# Patient Record
Sex: Female | Born: 1935 | Race: White | Hispanic: No | Smoking: Former smoker
Health system: Southern US, Community
[De-identification: ages and names within clinical notes are randomized; demographics above are authoritative.]

## PROBLEM LIST (undated history)

## (undated) DIAGNOSIS — F329 Major depressive disorder, single episode, unspecified: Secondary | ICD-10-CM

## (undated) DIAGNOSIS — F0390 Unspecified dementia without behavioral disturbance: Secondary | ICD-10-CM

## (undated) DIAGNOSIS — T7840XA Allergy, unspecified, initial encounter: Secondary | ICD-10-CM

## (undated) DIAGNOSIS — I00 Rheumatic fever without heart involvement: Secondary | ICD-10-CM

## (undated) DIAGNOSIS — E079 Disorder of thyroid, unspecified: Secondary | ICD-10-CM

## (undated) DIAGNOSIS — I639 Cerebral infarction, unspecified: Secondary | ICD-10-CM

## (undated) DIAGNOSIS — N390 Urinary tract infection, site not specified: Secondary | ICD-10-CM

## (undated) DIAGNOSIS — Z8711 Personal history of peptic ulcer disease: Secondary | ICD-10-CM

## (undated) DIAGNOSIS — E119 Type 2 diabetes mellitus without complications: Secondary | ICD-10-CM

## (undated) DIAGNOSIS — Z8719 Personal history of other diseases of the digestive system: Secondary | ICD-10-CM

## (undated) DIAGNOSIS — J45909 Unspecified asthma, uncomplicated: Secondary | ICD-10-CM

## (undated) DIAGNOSIS — F32A Depression, unspecified: Secondary | ICD-10-CM

## (undated) DIAGNOSIS — E785 Hyperlipidemia, unspecified: Secondary | ICD-10-CM

## (undated) DIAGNOSIS — I10 Essential (primary) hypertension: Secondary | ICD-10-CM

## (undated) DIAGNOSIS — M1990 Unspecified osteoarthritis, unspecified site: Secondary | ICD-10-CM

## (undated) HISTORY — DX: Personal history of peptic ulcer disease: Z87.11

## (undated) HISTORY — PX: APPENDECTOMY: SHX54

## (undated) HISTORY — DX: Type 2 diabetes mellitus without complications: E11.9

## (undated) HISTORY — DX: Hyperlipidemia, unspecified: E78.5

## (undated) HISTORY — DX: Allergy, unspecified, initial encounter: T78.40XA

## (undated) HISTORY — PX: TONSILLECTOMY: SUR1361

## (undated) HISTORY — DX: Unspecified asthma, uncomplicated: J45.909

## (undated) HISTORY — DX: Essential (primary) hypertension: I10

## (undated) HISTORY — PX: ABDOMINAL HYSTERECTOMY: SHX81

## (undated) HISTORY — DX: Depression, unspecified: F32.A

## (undated) HISTORY — DX: Rheumatic fever without heart involvement: I00

## (undated) HISTORY — PX: CHOLECYSTECTOMY: SHX55

## (undated) HISTORY — DX: Urinary tract infection, site not specified: N39.0

## (undated) HISTORY — DX: Major depressive disorder, single episode, unspecified: F32.9

## (undated) HISTORY — DX: Unspecified osteoarthritis, unspecified site: M19.90

## (undated) HISTORY — DX: Cerebral infarction, unspecified: I63.9

## (undated) HISTORY — DX: Disorder of thyroid, unspecified: E07.9

## (undated) HISTORY — DX: Personal history of other diseases of the digestive system: Z87.19

---

## 2016-12-28 ENCOUNTER — Encounter: Payer: Self-pay | Admitting: Family Medicine

## 2016-12-31 ENCOUNTER — Encounter: Payer: Self-pay | Admitting: Family Medicine

## 2017-01-13 ENCOUNTER — Encounter: Payer: Self-pay | Admitting: Family Medicine

## 2017-01-28 ENCOUNTER — Encounter: Payer: Self-pay | Admitting: Family Medicine

## 2017-02-17 ENCOUNTER — Encounter: Payer: Self-pay | Admitting: Family Medicine

## 2017-03-18 ENCOUNTER — Encounter: Payer: Self-pay | Admitting: Family Medicine

## 2017-06-03 ENCOUNTER — Encounter: Payer: Self-pay | Admitting: Family Medicine

## 2017-06-10 ENCOUNTER — Encounter: Payer: Self-pay | Admitting: Family Medicine

## 2017-06-11 ENCOUNTER — Encounter: Payer: Self-pay | Admitting: Family Medicine

## 2017-06-25 ENCOUNTER — Encounter: Payer: Self-pay | Admitting: Family Medicine

## 2017-07-25 ENCOUNTER — Encounter: Payer: Self-pay | Admitting: Family Medicine

## 2017-11-05 ENCOUNTER — Inpatient Hospital Stay (HOSPITAL_COMMUNITY)
Admission: EM | Admit: 2017-11-05 | Discharge: 2017-11-07 | DRG: 193 | Disposition: A | Discharge status: Home Health | Payer: Medicare Other | Attending: Internal Medicine | Admitting: Internal Medicine

## 2017-11-05 ENCOUNTER — Emergency Department (HOSPITAL_COMMUNITY): Payer: Medicare Other

## 2017-11-05 ENCOUNTER — Encounter (HOSPITAL_COMMUNITY): Payer: Self-pay

## 2017-11-05 ENCOUNTER — Other Ambulatory Visit: Payer: Self-pay

## 2017-11-05 DIAGNOSIS — E039 Hypothyroidism, unspecified: Secondary | ICD-10-CM | POA: Diagnosis present

## 2017-11-05 DIAGNOSIS — Z87891 Personal history of nicotine dependence: Secondary | ICD-10-CM

## 2017-11-05 DIAGNOSIS — J181 Lobar pneumonia, unspecified organism: Secondary | ICD-10-CM | POA: Diagnosis not present

## 2017-11-05 DIAGNOSIS — Z7982 Long term (current) use of aspirin: Secondary | ICD-10-CM | POA: Diagnosis not present

## 2017-11-05 DIAGNOSIS — K219 Gastro-esophageal reflux disease without esophagitis: Secondary | ICD-10-CM | POA: Diagnosis present

## 2017-11-05 DIAGNOSIS — E785 Hyperlipidemia, unspecified: Secondary | ICD-10-CM | POA: Diagnosis present

## 2017-11-05 DIAGNOSIS — F329 Major depressive disorder, single episode, unspecified: Secondary | ICD-10-CM | POA: Diagnosis present

## 2017-11-05 DIAGNOSIS — R0902 Hypoxemia: Secondary | ICD-10-CM | POA: Diagnosis not present

## 2017-11-05 DIAGNOSIS — I10 Essential (primary) hypertension: Secondary | ICD-10-CM | POA: Diagnosis present

## 2017-11-05 DIAGNOSIS — J9601 Acute respiratory failure with hypoxia: Secondary | ICD-10-CM | POA: Diagnosis present

## 2017-11-05 DIAGNOSIS — N390 Urinary tract infection, site not specified: Secondary | ICD-10-CM | POA: Diagnosis present

## 2017-11-05 DIAGNOSIS — I480 Paroxysmal atrial fibrillation: Secondary | ICD-10-CM | POA: Diagnosis present

## 2017-11-05 DIAGNOSIS — J101 Influenza due to other identified influenza virus with other respiratory manifestations: Secondary | ICD-10-CM | POA: Diagnosis not present

## 2017-11-05 DIAGNOSIS — E119 Type 2 diabetes mellitus without complications: Secondary | ICD-10-CM | POA: Diagnosis present

## 2017-11-05 DIAGNOSIS — J1000 Influenza due to other identified influenza virus with unspecified type of pneumonia: Secondary | ICD-10-CM | POA: Diagnosis present

## 2017-11-05 DIAGNOSIS — Z794 Long term (current) use of insulin: Secondary | ICD-10-CM

## 2017-11-05 DIAGNOSIS — F0390 Unspecified dementia without behavioral disturbance: Secondary | ICD-10-CM | POA: Diagnosis present

## 2017-11-05 DIAGNOSIS — Z9119 Patient's noncompliance with other medical treatment and regimen: Secondary | ICD-10-CM

## 2017-11-05 DIAGNOSIS — Z79899 Other long term (current) drug therapy: Secondary | ICD-10-CM

## 2017-11-05 DIAGNOSIS — J189 Pneumonia, unspecified organism: Secondary | ICD-10-CM | POA: Diagnosis present

## 2017-11-05 DIAGNOSIS — R531 Weakness: Secondary | ICD-10-CM

## 2017-11-05 DIAGNOSIS — F32A Depression, unspecified: Secondary | ICD-10-CM | POA: Diagnosis present

## 2017-11-05 HISTORY — DX: Unspecified dementia, unspecified severity, without behavioral disturbance, psychotic disturbance, mood disturbance, and anxiety: F03.90

## 2017-11-05 HISTORY — DX: Unspecified dementia without behavioral disturbance: F03.90

## 2017-11-05 LAB — CBC
HEMATOCRIT: 39.4 % (ref 36.0–46.0)
HEMOGLOBIN: 12.9 g/dL (ref 12.0–15.0)
MCH: 28 pg (ref 26.0–34.0)
MCHC: 32.7 g/dL (ref 30.0–36.0)
MCV: 85.7 fL (ref 78.0–100.0)
Platelets: 224 10*3/uL (ref 150–400)
RBC: 4.6 MIL/uL (ref 3.87–5.11)
RDW: 15.2 % (ref 11.5–15.5)
WBC: 6.6 10*3/uL (ref 4.0–10.5)

## 2017-11-05 LAB — CBC WITH DIFFERENTIAL/PLATELET
BASOS ABS: 0 10*3/uL (ref 0.0–0.1)
Basophils Relative: 0 %
Eosinophils Absolute: 0.1 10*3/uL (ref 0.0–0.7)
Eosinophils Relative: 1 %
HCT: 39.5 % (ref 36.0–46.0)
HEMOGLOBIN: 13.3 g/dL (ref 12.0–15.0)
LYMPHS ABS: 1.1 10*3/uL (ref 0.7–4.0)
LYMPHS PCT: 16 %
MCH: 28.7 pg (ref 26.0–34.0)
MCHC: 33.7 g/dL (ref 30.0–36.0)
MCV: 85.3 fL (ref 78.0–100.0)
Monocytes Absolute: 1 10*3/uL (ref 0.1–1.0)
Monocytes Relative: 15 %
NEUTROS ABS: 4.7 10*3/uL (ref 1.7–7.7)
NEUTROS PCT: 68 %
PLATELETS: 212 10*3/uL (ref 150–400)
RBC: 4.63 MIL/uL (ref 3.87–5.11)
RDW: 14.9 % (ref 11.5–15.5)
WBC: 6.9 10*3/uL (ref 4.0–10.5)

## 2017-11-05 LAB — COMPREHENSIVE METABOLIC PANEL
ALK PHOS: 94 U/L (ref 38–126)
ALT: 25 U/L (ref 14–54)
AST: 30 U/L (ref 15–41)
Albumin: 3.8 g/dL (ref 3.5–5.0)
Anion gap: 10 (ref 5–15)
BUN: 14 mg/dL (ref 6–20)
CALCIUM: 9.4 mg/dL (ref 8.9–10.3)
CHLORIDE: 99 mmol/L — AB (ref 101–111)
CO2: 24 mmol/L (ref 22–32)
CREATININE: 0.64 mg/dL (ref 0.44–1.00)
GFR calc Af Amer: >60 mL/min (ref >60)
Glucose, Bld: 281 mg/dL — ABNORMAL HIGH (ref 65–99)
Potassium: 3.6 mmol/L (ref 3.5–5.1)
Sodium: 133 mmol/L — ABNORMAL LOW (ref 135–145)
Total Bilirubin: 0.3 mg/dL (ref 0.3–1.2)
Total Protein: 7.3 g/dL (ref 6.5–8.1)

## 2017-11-05 LAB — TROPONIN I

## 2017-11-05 LAB — URINALYSIS, ROUTINE W REFLEX MICROSCOPIC
Bilirubin Urine: NEGATIVE
Glucose, UA: >=500 mg/dL — AB
Hgb urine dipstick: NEGATIVE
Ketones, ur: 5 mg/dL — AB
NITRITE: NEGATIVE
PH: 5 (ref 5.0–8.0)
Protein, ur: 30 mg/dL — AB
SPECIFIC GRAVITY, URINE: 1.023 (ref 1.005–1.030)

## 2017-11-05 LAB — GLUCOSE, CAPILLARY: GLUCOSE-CAPILLARY: 268 mg/dL — AB (ref 65–99)

## 2017-11-05 LAB — CREATININE, SERUM
CREATININE: 0.69 mg/dL (ref 0.44–1.00)
GFR calc Af Amer: >60 mL/min (ref >60)
GFR calc non Af Amer: >60 mL/min (ref >60)

## 2017-11-05 LAB — HEMOGLOBIN A1C
HEMOGLOBIN A1C: 11.8 % — AB (ref 4.8–5.6)
MEAN PLASMA GLUCOSE: 291.96 mg/dL

## 2017-11-05 LAB — RAPID URINE DRUG SCREEN, HOSP PERFORMED
AMPHETAMINES: NOT DETECTED
BARBITURATES: NOT DETECTED
BENZODIAZEPINES: NOT DETECTED
Cocaine: NOT DETECTED
Opiates: NOT DETECTED
Tetrahydrocannabinol: NOT DETECTED

## 2017-11-05 LAB — INFLUENZA PANEL BY PCR (TYPE A & B)
INFLAPCR: POSITIVE — AB
INFLBPCR: NEGATIVE

## 2017-11-05 LAB — TSH: TSH: 0.965 u[IU]/mL (ref 0.350–4.500)

## 2017-11-05 LAB — DIGOXIN LEVEL: Digoxin Level: 0.3 ng/mL — ABNORMAL LOW (ref 0.8–2.0)

## 2017-11-05 LAB — ETHANOL: Alcohol, Ethyl (B): <10 mg/dL (ref <10)

## 2017-11-05 MED ORDER — DILTIAZEM HCL ER COATED BEADS 120 MG PO CP24
120.0000 mg | ORAL_CAPSULE | Freq: Every day | ORAL | Status: DC
  Administered 2017-11-05 – 2017-11-07 (×3): 120 mg via ORAL
  Filled 2017-11-05 (×3): qty 1

## 2017-11-05 MED ORDER — SODIUM CHLORIDE 0.9 % IV SOLN
INTRAVENOUS | Status: AC
  Administered 2017-11-05: 20:00:00 via INTRAVENOUS

## 2017-11-05 MED ORDER — LEVOTHYROXINE SODIUM 112 MCG PO TABS
112.0000 ug | ORAL_TABLET | Freq: Every day | ORAL | Status: DC
  Administered 2017-11-05 – 2017-11-07 (×3): 112 ug via ORAL
  Filled 2017-11-05 (×3): qty 1

## 2017-11-05 MED ORDER — DOXYCYCLINE HYCLATE 100 MG IV SOLR
100.0000 mg | Freq: Once | INTRAVENOUS | Status: AC
  Administered 2017-11-05: 100 mg via INTRAVENOUS
  Filled 2017-11-05: qty 100

## 2017-11-05 MED ORDER — DULOXETINE HCL 30 MG PO CPEP
30.0000 mg | ORAL_CAPSULE | Freq: Every day | ORAL | Status: DC
  Administered 2017-11-05 – 2017-11-07 (×3): 30 mg via ORAL
  Filled 2017-11-05 (×3): qty 1

## 2017-11-05 MED ORDER — OSELTAMIVIR PHOSPHATE 75 MG PO CAPS
75.0000 mg | ORAL_CAPSULE | Freq: Two times a day (BID) | ORAL | Status: DC
  Administered 2017-11-05 – 2017-11-07 (×4): 75 mg via ORAL
  Filled 2017-11-05 (×5): qty 1

## 2017-11-05 MED ORDER — HYDROXYZINE HCL 25 MG PO TABS
25.0000 mg | ORAL_TABLET | Freq: Three times a day (TID) | ORAL | Status: DC | PRN
  Administered 2017-11-05: 25 mg via ORAL
  Filled 2017-11-05: qty 1

## 2017-11-05 MED ORDER — SIMVASTATIN 10 MG PO TABS
10.0000 mg | ORAL_TABLET | Freq: Every evening | ORAL | Status: DC
  Administered 2017-11-05 – 2017-11-06 (×2): 10 mg via ORAL
  Filled 2017-11-05 (×2): qty 1

## 2017-11-05 MED ORDER — DEXTROSE 5 % IV SOLN
100.0000 mg | Freq: Two times a day (BID) | INTRAVENOUS | Status: DC
  Administered 2017-11-06 – 2017-11-07 (×3): 100 mg via INTRAVENOUS
  Filled 2017-11-05 (×4): qty 100

## 2017-11-05 MED ORDER — FOSFOMYCIN TROMETHAMINE 3 G PO PACK
3.0000 g | PACK | Freq: Once | ORAL | Status: AC
  Administered 2017-11-05: 3 g via ORAL
  Filled 2017-11-05: qty 3

## 2017-11-05 MED ORDER — DONEPEZIL HCL 5 MG PO TABS
10.0000 mg | ORAL_TABLET | Freq: Every evening | ORAL | Status: DC
  Administered 2017-11-05 – 2017-11-06 (×2): 10 mg via ORAL
  Filled 2017-11-05 (×3): qty 2

## 2017-11-05 MED ORDER — ONDANSETRON HCL 4 MG/2ML IJ SOLN: 4.0000 mg | Freq: Four times a day (QID) | INTRAMUSCULAR | Status: DC | PRN

## 2017-11-05 MED ORDER — INSULIN ASPART 100 UNIT/ML ~~LOC~~ SOLN
0.0000 [IU] | Freq: Three times a day (TID) | SUBCUTANEOUS | Status: DC
  Administered 2017-11-06 (×2): 5 [IU] via SUBCUTANEOUS
  Administered 2017-11-06: 2 [IU] via SUBCUTANEOUS
  Administered 2017-11-07 (×2): 8 [IU] via SUBCUTANEOUS

## 2017-11-05 MED ORDER — VITAMIN C 500 MG PO TABS
1000.0000 mg | ORAL_TABLET | Freq: Every day | ORAL | Status: DC
  Administered 2017-11-06 – 2017-11-07 (×2): 1000 mg via ORAL
  Filled 2017-11-05 (×2): qty 2

## 2017-11-05 MED ORDER — DIGOXIN 125 MCG PO TABS
125.0000 ug | ORAL_TABLET | Freq: Every day | ORAL | Status: DC
  Administered 2017-11-05 – 2017-11-07 (×3): 125 ug via ORAL
  Filled 2017-11-05 (×3): qty 1

## 2017-11-05 MED ORDER — DM-GUAIFENESIN ER 30-600 MG PO TB12
1.0000 | ORAL_TABLET | Freq: Two times a day (BID) | ORAL | Status: DC
  Administered 2017-11-05 – 2017-11-07 (×4): 1 via ORAL
  Filled 2017-11-05 (×4): qty 1

## 2017-11-05 MED ORDER — ONDANSETRON HCL 4 MG PO TABS: 4.0000 mg | ORAL_TABLET | Freq: Four times a day (QID) | ORAL | Status: DC | PRN

## 2017-11-05 MED ORDER — ASPIRIN 81 MG PO CHEW
81.0000 mg | CHEWABLE_TABLET | Freq: Every day | ORAL | Status: DC
  Administered 2017-11-05 – 2017-11-07 (×3): 81 mg via ORAL
  Filled 2017-11-05 (×3): qty 1

## 2017-11-05 MED ORDER — IPRATROPIUM-ALBUTEROL 0.5-2.5 (3) MG/3ML IN SOLN: 3.0000 mL | RESPIRATORY_TRACT | Status: DC | PRN

## 2017-11-05 MED ORDER — MECLIZINE HCL 25 MG PO TABS
25.0000 mg | ORAL_TABLET | Freq: Four times a day (QID) | ORAL | Status: DC | PRN
  Administered 2017-11-05: 25 mg via ORAL
  Filled 2017-11-05: qty 1

## 2017-11-05 MED ORDER — HEPARIN SODIUM (PORCINE) 5000 UNIT/ML IJ SOLN
5000.0000 [IU] | Freq: Three times a day (TID) | INTRAMUSCULAR | Status: DC
  Administered 2017-11-05 – 2017-11-07 (×5): 5000 [IU] via SUBCUTANEOUS
  Filled 2017-11-05 (×5): qty 1

## 2017-11-05 MED ORDER — INSULIN DETEMIR 100 UNIT/ML ~~LOC~~ SOLN
30.0000 [IU] | Freq: Every day | SUBCUTANEOUS | Status: DC
  Administered 2017-11-05: 30 [IU] via SUBCUTANEOUS
  Filled 2017-11-05 (×2): qty 0.3

## 2017-11-05 MED ORDER — PANTOPRAZOLE SODIUM 40 MG PO TBEC
40.0000 mg | DELAYED_RELEASE_TABLET | Freq: Two times a day (BID) | ORAL | Status: DC
  Administered 2017-11-05 – 2017-11-07 (×4): 40 mg via ORAL
  Filled 2017-11-05 (×4): qty 1

## 2017-11-05 NOTE — ED Notes (Signed)
Ambulated pt and O2 dropped to 86%

## 2017-11-05 NOTE — ED Notes (Signed)
ED TO INPATIENT HANDOFF REPORT  Name/Age/Gender Roberta Anderson 84 y.o. female  Code Status    Code Status Orders  (From admission, onward)        Start     Ordered   11/05/17 1822  Full code  Continuous     11/05/17 1826    Code Status History    Date Active Date Inactive Code Status Order ID Comments User Context   This patient has a current code status but no historical code status.      Home/SNF/Other Home  Chief Complaint fall / weakness   Level of Care/Admitting Diagnosis ED Disposition    ED Disposition Condition Comment   Admit  Hospital Area: [facility] [[id number]]  Level of Care: Med-Surg [16]  Diagnosis: CAP (community acquired pneumonia) [270623]  Admitting Physician: Barton Dubois [[id number]]  Attending Physician: Barton Dubois [[id number]]  Estimated length of stay: past midnight tomorrow  Certification:: I certify this patient will need inpatient services for at least 2 midnights  PT Class (Do Not Modify): Inpatient [101]  PT Acc Code (Do Not Modify): Private [1]       Medical History Past Medical History:  Diagnosis Date  . Dementia 11/05/2017    Allergies Allergies  Allergen Reactions  . Hydrocodone-Acetaminophen Shortness Of Breath  . Levofloxacin Shortness Of Breath  . Cephalexin Rash  . Ciprofloxacin Rash  . Codeine Rash  . Sulfamethoxazole-Trimethoprim Rash    IV Location/Drains/Wounds Patient Lines/Drains/Airways Status   Active Line/Drains/Airways    Name:   Placement date:   Placement time:   Site:   Days:   Peripheral IV 11/05/17 Left Antecubital   11/05/17    1643    Antecubital   less than 1          Labs/Imaging Results for orders placed or performed during the hospital encounter of 11/05/17 (from the past 48 hour(s))  CBC with Differential/Platelet     Status: None   Collection Time: 11/05/17 10:43 AM  Result Value Ref Range   WBC 6.9 4.0 - 10.5 K/uL   RBC 4.63 3.87 - 5.11 MIL/uL   Hemoglobin 13.3  12.0 - 15.0 g/dL   HCT 39.5 36.0 - 46.0 %   MCV 85.3 78.0 - 100.0 fL   MCH 28.7 26.0 - 34.0 pg   MCHC 33.7 30.0 - 36.0 g/dL   RDW 14.9 11.5 - 15.5 %   Platelets 212 150 - 400 K/uL   Neutrophils Relative % 68 %   Neutro Abs 4.7 1.7 - 7.7 K/uL   Lymphocytes Relative 16 %   Lymphs Abs 1.1 0.7 - 4.0 K/uL   Monocytes Relative 15 %   Monocytes Absolute 1.0 0.1 - 1.0 K/uL   Eosinophils Relative 1 %   Eosinophils Absolute 0.1 0.0 - 0.7 K/uL   Basophils Relative 0 %   Basophils Absolute 0.0 0.0 - 0.1 K/uL  Comprehensive metabolic panel     Status: Abnormal   Collection Time: 11/05/17 10:43 AM  Result Value Ref Range   Sodium 133 (L) 135 - 145 mmol/L   Potassium 3.6 3.5 - 5.1 mmol/L   Chloride 99 (L) 101 - 111 mmol/L   CO2 24 22 - 32 mmol/L   Glucose, Bld 281 (H) 65 - 99 mg/dL   BUN 14 6 - 20 mg/dL   Creatinine, Ser 0.64 0.44 - 1.00 mg/dL   Calcium 9.4 8.9 - 10.3 mg/dL   Total Protein 7.3 6.5 - 8.1 g/dL   Albumin 3.8  3.5 - 5.0 g/dL   AST 30 15 - 41 U/L   ALT 25 14 - 54 U/L   Alkaline Phosphatase 94 38 - 126 U/L   Total Bilirubin 0.3 0.3 - 1.2 mg/dL   GFR calc non Af Amer >60 >60 mL/min   GFR calc Af Amer >60 >60 mL/min    Comment: (NOTE) The eGFR has been calculated using the CKD EPI equation. This calculation has not been validated in all clinical situations. eGFR's persistently <60 mL/min signify possible Chronic Kidney Disease.    Anion gap 10 5 - 15  Troponin I     Status: None   Collection Time: 11/05/17 10:43 AM  Result Value Ref Range   Troponin I <0.03 <0.03 ng/mL  Ethanol     Status: None   Collection Time: 11/05/17 10:43 AM  Result Value Ref Range   Alcohol, Ethyl (B) <10 <10 mg/dL    Comment:        LOWEST DETECTABLE LIMIT FOR SERUM ALCOHOL IS 10 mg/dL FOR MEDICAL PURPOSES ONLY   Rapid urine drug screen (hospital performed)     Status: None   Collection Time: 11/05/17  2:54 PM  Result Value Ref Range   Opiates NONE DETECTED NONE DETECTED   Cocaine NONE  DETECTED NONE DETECTED   Benzodiazepines NONE DETECTED NONE DETECTED   Amphetamines NONE DETECTED NONE DETECTED   Tetrahydrocannabinol NONE DETECTED NONE DETECTED   Barbiturates NONE DETECTED NONE DETECTED    Comment: (NOTE) DRUG SCREEN FOR MEDICAL PURPOSES ONLY.  IF CONFIRMATION IS NEEDED FOR ANY PURPOSE, NOTIFY LAB WITHIN 5 DAYS. LOWEST DETECTABLE LIMITS FOR URINE DRUG SCREEN Drug Class                     Cutoff (ng/mL) Amphetamine and metabolites    1000 Barbiturate and metabolites    200 Benzodiazepine                 161 Tricyclics and metabolites     300 Opiates and metabolites        300 Cocaine and metabolites        300 THC                            50   Urinalysis, Routine w reflex microscopic     Status: Abnormal   Collection Time: 11/05/17  2:54 PM  Result Value Ref Range   Color, Urine AMBER (A) YELLOW    Comment: BIOCHEMICALS MAY BE AFFECTED BY COLOR   APPearance CLOUDY (A) CLEAR   Specific Gravity, Urine 1.023 1.005 - 1.030   pH 5.0 5.0 - 8.0   Glucose, UA >=500 (A) NEGATIVE mg/dL   Hgb urine dipstick NEGATIVE NEGATIVE   Bilirubin Urine NEGATIVE NEGATIVE   Ketones, ur 5 (A) NEGATIVE mg/dL   Protein, ur 30 (A) NEGATIVE mg/dL   Nitrite NEGATIVE NEGATIVE   Leukocytes, UA LARGE (A) NEGATIVE   RBC / HPF TOO NUMEROUS TO COUNT 0 - 5 RBC/hpf   WBC, UA TOO NUMEROUS TO COUNT 0 - 5 WBC/hpf   Bacteria, UA FEW (A) NONE SEEN   Squamous Epithelial / LPF 6-30 (A) NONE SEEN   WBC Clumps PRESENT    Mucus PRESENT   Influenza panel by PCR (type A & B)     Status: Abnormal   Collection Time: 11/05/17  4:55 PM  Result Value Ref Range   Influenza A By PCR POSITIVE (A)  NEGATIVE   Influenza B By PCR NEGATIVE NEGATIVE    Comment: (NOTE) The Xpert Xpress Flu assay is intended as an aid in the diagnosis of  influenza and should not be used as a sole basis for treatment.  This  assay is FDA approved for nasopharyngeal swab specimens only. Nasal  washings and aspirates are  unacceptable for Xpert Xpress Flu testing.   CBC     Status: None   Collection Time: 11/05/17  6:53 PM  Result Value Ref Range   WBC 6.6 4.0 - 10.5 K/uL   RBC 4.60 3.87 - 5.11 MIL/uL   Hemoglobin 12.9 12.0 - 15.0 g/dL   HCT 39.4 36.0 - 46.0 %   MCV 85.7 78.0 - 100.0 fL   MCH 28.0 26.0 - 34.0 pg   MCHC 32.7 30.0 - 36.0 g/dL   RDW 15.2 11.5 - 15.5 %   Platelets 224 150 - 400 K/uL   Dg Chest 2 View  Result Date: 11/05/2017 CLINICAL DATA:  Generalize weakness increasing over the past 2 days. The patient has fallen several times over the past several days. EXAM: CHEST  2 VIEW COMPARISON:  None in PACs FINDINGS: The lungs are adequately inflated. There is no focal infiltrate. The lung markings are coarse in the retrocardiac region however. The heart is normal in size. There is a stent in the ascending aorta. There is calcification in the wall of the aortic arch and tortuosity of the descending thoracic aorta. There are postsurgical changes of both shoulders. There is degenerative disc disease of the lower thoracic spine. IMPRESSION: There is no CHF. Atelectasis or early pneumonia in the left lower lobe is not excluded. Correlation with any respiratory symptoms is needed. No CHF nor other acute cardiopulmonary abnormality. Thoracic aortic atherosclerosis. Electronically Signed   By: David  Martinique M.D.   On: 11/05/2017 11:23   Ct Head Wo Contrast  Result Date: 11/05/2017 CLINICAL DATA:  EMS reports from home, family states repeated witnessed falls today. Generalized weakness x 2 days. Hx of dementia, diabetes, CAD. Denies pain. EXAM: CT HEAD WITHOUT CONTRAST TECHNIQUE: Contiguous axial images were obtained from the base of the skull through the vertex without intravenous contrast. COMPARISON:  None. FINDINGS: Brain: Diffuse parenchymal atrophy. Patchy areas of hypoattenuation in deep and periventricular white matter bilaterally. Negative for acute intracranial hemorrhage, mass lesion, acute infarction,  midline shift, or mass-effect. Acute infarct may be inapparent on noncontrast CT. Ventricles and sulci symmetric. Vascular: Atherosclerotic and physiologic intracranial calcifications. Skull: Normal. Negative for fracture or focal lesion. Sinuses/Orbits: No acute finding. Other: None. IMPRESSION: 1. Negative for bleed or other acute intracranial process. 2. Atrophy and nonspecific white matter changes. Electronically Signed   By: Lucrezia Europe M.D.   On: 11/05/2017 12:41    Pending Labs Unresulted Labs (From admission, onward)   Start     Ordered   11/06/17 5852  Basic metabolic panel  Tomorrow morning,   R     11/05/17 1826   11/06/17 0500  CBC  Tomorrow morning,   R     11/05/17 1826   11/05/17 1827  Digoxin level  Once,   R     11/05/17 1826   11/05/17 1826  Legionella Pneumophila Serogp 1 Ur Ag  Once,   R     11/05/17 1826   11/05/17 1824  TSH  Once,   R     11/05/17 1826   11/05/17 1824  Hemoglobin A1c  Once,   R  11/05/17 1826   11/05/17 1820  Culture, blood (routine x 2) Call MD if unable to obtain prior to antibiotics being given  BLOOD CULTURE X 2,   R    Comments:  If blood cultures drawn in Emergency Department - Do not draw and cancel order    11/05/17 1826   11/05/17 1820  Culture, sputum-assessment  Once,   R     11/05/17 1826   11/05/17 1820  Gram stain  Once,   R     11/05/17 1826   11/05/17 1820  HIV antibody  Once,   R     11/05/17 1826   11/05/17 1820  Strep pneumoniae urinary antigen  Once,   R     11/05/17 1826   11/05/17 1820  Creatinine, serum  (heparin)  Once,   R    Comments:  Baseline for heparin therapy IF NOT ALREADY DRAWN.    11/05/17 1826   11/05/17 1538  Urine culture  STAT,   STAT     11/05/17 1539      Vitals/Pain Today's Vitals   11/05/17 1633 11/05/17 1707 11/05/17 1730 11/05/17 1829  BP: (!) 129/51 (!) 143/60 132/66 132/66  Pulse: 88 85 90 90  Resp: (!) 25 16 (!) 26 (!) 26  Temp:      TempSrc:      SpO2: 98% 97% 95% 95%  Weight:       Height:        Isolation Precautions Droplet precaution  Medications Medications  doxycycline (VIBRAMYCIN) 100 mg in dextrose 5 % 250 mL IVPB (not administered)  vitamin C (ASCORBIC ACID) tablet 1,000 mg (not administered)  aspirin chewable tablet 81 mg (not administered)  digoxin (LANOXIN) tablet 125 mcg (not administered)  diltiazem (CARDIZEM CD) 24 hr capsule 120 mg (not administered)  donepezil (ARICEPT) tablet 10 mg (not administered)  DULoxetine (CYMBALTA) DR capsule 30 mg (not administered)  hydrOXYzine (ATARAX/VISTARIL) tablet 25 mg (not administered)  Insulin Detemir (LEVEMIR) FlexPen 30 Units (not administered)  levothyroxine (SYNTHROID, LEVOTHROID) tablet 112 mcg (not administered)  meclizine (ANTIVERT) tablet 25 mg (not administered)  pantoprazole (PROTONIX) EC tablet 40 mg (not administered)  simvastatin (ZOCOR) tablet 10 mg (not administered)  dextromethorphan-guaiFENesin (MUCINEX DM) 30-600 MG per 12 hr tablet 1 tablet (not administered)  heparin injection 5,000 Units (not administered)  0.9 %  sodium chloride infusion (not administered)  ondansetron (ZOFRAN) tablet 4 mg (not administered)    Or  ondansetron (ZOFRAN) injection 4 mg (not administered)  insulin aspart (novoLOG) injection 0-15 Units (not administered)  ipratropium-albuterol (DUONEB) 0.5-2.5 (3) MG/3ML nebulizer solution 3 mL (not administered)  oseltamivir (TAMIFLU) capsule 75 mg (not administered)  fosfomycin (MONUROL) packet 3 g (3 g Oral Given 11/05/17 1641)  doxycycline (VIBRAMYCIN) 100 mg in dextrose 5 % 250 mL IVPB (0 mg Intravenous Stopped 11/05/17 1845)    Mobility walks

## 2017-11-05 NOTE — ED Provider Notes (Signed)
[facility]-EMERGENCY DEPT Provider Note   CSN: [id number] Arrival date & time: 11/05/17  0931     History   Chief Complaint Chief Complaint  Patient presents with  . Fall    HPI Roberta Anderson is a 84 y.o. female.  HPI Patient is coming from home.  Endorses generalized weakness and fatigue worsening of the past few days.  Has had several falls but she denies syncope, head injury or neck injury.  She has had a cough with minimal sputum production.  Has had several bouts of diarrhea but denies nausea or vomiting.  No fever or chills.  Denies any urinary symptoms. Past Medical History:  Diagnosis Date  . Dementia 11/05/2017    There are no active problems to display for this patient.   History reviewed. No pertinent surgical history.  OB History    No data available       Home Medications    Prior to Admission medications   Medication Sig Start Date End Date Taking? Authorizing Provider  Ascorbic Acid (VITAMIN C) 1000 MG tablet Take 1,000 mg by mouth daily.   Yes [provider]  aspirin 81 MG chewable tablet Chew 81 mg by mouth daily. 11/09/16 11/09/17 Yes [provider]  B Complex Vitamins (B COMPLEX PO) Take 1 tablet by mouth daily.   Yes [provider]  Cholecalciferol (VITAMIN D PO) Take 1 tablet by mouth daily.   Yes [provider]  CINNAMON PO Take 1,000 mg by mouth daily.   Yes [provider]  Coenzyme Q10 (CO Q 10 PO) Take 1 tablet by mouth daily.   Yes [provider]  CRANBERRY PO Take 1 tablet by mouth daily.   Yes [provider]  digoxin (LANOXIN) 0.125 MG tablet Take 125 mcg by mouth daily. 10/15/17  Yes [provider]  diltiazem (CARDIZEM CD) 120 MG 24 hr capsule Take 120 mg by mouth daily. For blood pressure 09/17/17  Yes [provider]  Docusate Calcium (STOOL SOFTENER PO) Take 1 tablet by mouth daily.   Yes [provider]  donepezil  (ARICEPT) 10 MG tablet Take 10 mg by mouth every evening. 09/17/17  Yes [provider]  DULoxetine (CYMBALTA) 30 MG capsule Take 30 mg by mouth daily. 10/15/17  Yes [provider]  HUMALOG KWIKPEN 100 UNIT/ML KiwkPen Inject 2 Units into the skin 3 (three) times daily. [facility] 08/20/17  Yes [provider]  hydrOXYzine (ATARAX/VISTARIL) 25 MG tablet Take 25 mg by mouth 3 (three) times daily as needed for anxiety. 09/17/17  Yes [provider]  LEVEMIR FLEXTOUCH 100 UNIT/ML Pen Inject 42 Units into the skin every evening. ADJUST AS NEEDED 08/20/17  Yes [provider]  levothyroxine (SYNTHROID, LEVOTHROID) 112 MCG tablet Take 112 mcg by mouth daily. 09/16/17  Yes [provider]  meclizine (ANTIVERT) 25 MG tablet Take 25 mg by mouth 4 (four) times daily as needed for dizziness. 09/17/17  Yes [provider]  pantoprazole (PROTONIX) 40 MG tablet Take 40 mg by mouth 2 (two) times daily. 09/17/17  Yes [provider]  simvastatin (ZOCOR) 10 MG tablet Take 10 mg by mouth every evening. 09/17/17  Yes [provider]    Family History History reviewed. No pertinent family history.  Social History Social History   Tobacco Use  . Smoking status: Former Smoker    Last attempt to quit: 1995    Years since quitting: 24.0  . Smokeless  tobacco: Never Used  Substance Use Topics  . Alcohol use: No    Frequency: Never  . Drug use: No     Allergies   Hydrocodone-acetaminophen; Levofloxacin; Cephalexin; Ciprofloxacin; Codeine; and Sulfamethoxazole-trimethoprim   Review of Systems Review of Systems  Constitutional: Positive for fatigue. Negative for chills and fever.  HENT: Positive for sore throat. Negative for congestion and sinus pressure.   Eyes: Negative for visual disturbance.  Respiratory: Positive for cough and shortness of breath. Negative for wheezing.   Cardiovascular: Negative for chest pain, palpitations  and leg swelling.  Gastrointestinal: Positive for diarrhea. Negative for abdominal pain, constipation and nausea.  Genitourinary: Negative for dysuria, flank pain and frequency.  Musculoskeletal: Positive for gait problem. Negative for back pain, myalgias and neck pain.  Skin: Negative for rash and wound.  Neurological: Negative for dizziness, speech difficulty, weakness, light-headedness, numbness and headaches.  All other systems reviewed and are negative.    Physical Exam Updated Vital Signs BP (!) 142/73   Pulse 89   Temp 98.4 F (36.9 C) (Oral)   Resp 20   Ht 5\' 4"  (1.626 m)   Wt 79.4 kg (175 lb)   SpO2 94%   BMI 30.04 kg/m   Physical Exam  Constitutional: She is oriented to person, place, and time. She appears well-developed and well-nourished. No distress.  HENT:  Head: Normocephalic and atraumatic.  Mouth/Throat: Oropharynx is clear and moist.  No evidence of any head injury.  Patient does have erythema to the soft palate and posterior oropharynx.  No tonsillar exudates.  Eyes: EOM are normal. Pupils are equal, round, and reactive to light.  Few beats of horizontal nystagmus which is fatigable.  Pupils are 2 mm bilaterally  Neck: Normal range of motion. Neck supple.  No posterior midline cervical tenderness to palpation.  No meningismus.  Cardiovascular: Normal rate and regular rhythm. Exam reveals no gallop and no friction rub.  No murmur heard. Pulmonary/Chest: Effort normal. She has rales.  Patient has a few rales especially in the left upper lobe.  Abdominal: Soft. Bowel sounds are normal. There is no tenderness. There is no rebound and no guarding.  Musculoskeletal: Normal range of motion. She exhibits no edema or tenderness.  No midline thoracic or lumbar tenderness.  No CVA tenderness.  No lower extremity swelling, asymmetry or tenderness.  Neurological: She is alert and oriented to person, place, and time.  5/5 motor in all extremities.  Sensation intact.   Cranial nerves II through XII grossly intact.  Skin: Skin is warm and dry. Capillary refill takes less than 2 seconds. No rash noted. She is not diaphoretic. No erythema.  Psychiatric: She has a normal mood and affect. Her behavior is normal.  Nursing note and vitals reviewed.    ED Treatments / Results  Labs (all labs ordered are listed, but only abnormal results are displayed) Labs Reviewed  COMPREHENSIVE METABOLIC PANEL - Abnormal; Notable for the following components:      Result Value   Sodium 133 (*)    Chloride 99 (*)    Glucose, Bld 281 (*)    All other components within normal limits  URINALYSIS, ROUTINE W REFLEX MICROSCOPIC - Abnormal; Notable for the following components:   Color, Urine AMBER (*)    APPearance CLOUDY (*)    Glucose, UA >=500 (*)    Ketones, ur 5 (*)    Protein, ur 30 (*)    Leukocytes, UA LARGE (*)    Bacteria, UA FEW (*)  Squamous Epithelial / LPF 6-30 (*)    All other components within normal limits  URINE CULTURE  CBC WITH DIFFERENTIAL/PLATELET  TROPONIN I  ETHANOL  RAPID URINE DRUG SCREEN, HOSP PERFORMED    EKG  EKG Interpretation  Date/Time:  Wednesday November 05 2017 10:42:34 EST Ventricular Rate:  83 PR Interval:    QRS Duration: 85 QT Interval:  330 QTC Calculation: 388 R Axis:   -28 Text Interpretation:  Sinus rhythm Prolonged PR interval Borderline left axis deviation Borderline low voltage, extremity leads Borderline repolarization abnormality Confirmed by Loren Racer ([id number]) on 11/05/2017 10:47:38 AM       Radiology Dg Chest 2 View  Result Date: 11/05/2017 CLINICAL DATA:  Generalize weakness increasing over the past 2 days. The patient has fallen several times over the past several days. EXAM: CHEST  2 VIEW COMPARISON:  None in PACs FINDINGS: The lungs are adequately inflated. There is no focal infiltrate. The lung markings are coarse in the retrocardiac region however. The heart is normal in size. There is a stent in  the ascending aorta. There is calcification in the wall of the aortic arch and tortuosity of the descending thoracic aorta. There are postsurgical changes of both shoulders. There is degenerative disc disease of the lower thoracic spine. IMPRESSION: There is no CHF. Atelectasis or early pneumonia in the left lower lobe is not excluded. Correlation with any respiratory symptoms is needed. No CHF nor other acute cardiopulmonary abnormality. Thoracic aortic atherosclerosis. Electronically Signed   By: Jamye Balicki  [place] M.D.   On: 11/05/2017 11:23   Ct Head Wo Contrast  Result Date: 11/05/2017 CLINICAL DATA:  EMS reports from home, family states repeated witnessed falls today. Generalized weakness x 2 days. Hx of dementia, diabetes, CAD. Denies pain. EXAM: CT HEAD WITHOUT CONTRAST TECHNIQUE: Contiguous axial images were obtained from the base of the skull through the vertex without intravenous contrast. COMPARISON:  None. FINDINGS: Brain: Diffuse parenchymal atrophy. Patchy areas of hypoattenuation in deep and periventricular white matter bilaterally. Negative for acute intracranial hemorrhage, mass lesion, acute infarction, midline shift, or mass-effect. Acute infarct may be inapparent on noncontrast CT. Ventricles and sulci symmetric. Vascular: Atherosclerotic and physiologic intracranial calcifications. Skull: Normal. Negative for fracture or focal lesion. Sinuses/Orbits: No acute finding. Other: None. IMPRESSION: 1. Negative for bleed or other acute intracranial process. 2. Atrophy and nonspecific white matter changes. Electronically Signed   By: Corlis Leak M.D.   On: 11/05/2017 12:41    Procedures Procedures (including critical care time)  Medications Ordered in ED Medications  fosfomycin (MONUROL) packet 3 g (not administered)  doxycycline (VIBRAMYCIN) 100 mg in dextrose 5 % 250 mL IVPB (not administered)     Initial Impression / Assessment and Plan / ED Course  I have reviewed the triage vital signs  and the nursing notes.  Pertinent labs & imaging results that were available during my care of the patient were reviewed by me and considered in my medical decision making (see chart for details).     Questionable pneumonia on chest x-ray.  Patient has evidence of UTI.  Simply to ambulate patient and she became dyspneic and saturations dropped into the 80s.  She has multiple antibiotic allergies.  We will give fosfomycin for UTI and start doxycycline for presumed community acquired pneumonia.  Will discuss with hospitalist regarding admission.  Final Clinical Impressions(s) / ED Diagnoses   Final diagnoses:  Community acquired pneumonia, unspecified laterality  Acute lower UTI    ED Discharge Orders  None       Loren Racer, MD 11/05/17 1630

## 2017-11-05 NOTE — Progress Notes (Signed)
Patient approved of daughter and son-in-law to be in room during admission process questioning

## 2017-11-05 NOTE — ED Triage Notes (Signed)
EMS reports from home, family states repeated witnessed falls today. Generalized weakness x 2 days. Hx of dementia, diabetes, CAD. Denies pain.  BP 130/80 HR 86 Resp 18 Sp02 92 RA CBG 248

## 2017-11-05 NOTE — H&P (Signed)
History and Physical    Roberta Anderson XLK:[id number] DOB: 12/24/1933 DOA: 11/05/2017  PCP: Roberta River, MD   I have briefly reviewed patients previous medical reports in [facility].  Patient coming from: Home  Chief Complaint: fall, generalized weakness, SOB, no productive coughing spells  HPI: Roberta Anderson is a 84 y/o female with PMH significant for dementia, PAF, HTN, HLD, type 2 diabetes on chronic insulin and GERD; who was brought to ED secondary to generalized, weakness, falls, non-productive cough and SOB. Patient symptoms present for the last 2 days prior to admission and worsening. No hemoptysis, CP, nausea, vomiting, abd pain, focal neurologic or motor weakness. Family reported increased frequency, but no frank dysuria.  Patient also expressed chills and generalized weakness.   ED Course: patient with documented hypoxia in mid 80's on exertion, CXR suggesting PNA and UA with high concerns for UTI. Cultures taken and antibiotics (doxycyclin and fosfomycin initiated).  Review of Systems:  All other systems reviewed and apart from HPI, are negative.  Past Medical History:  Diagnosis Date  . Dementia 11/05/2017    History reviewed. No pertinent surgical history.  Social History  reports that she quit smoking about 24 years ago. she has never used smokeless tobacco. She reports that she does not drink alcohol or use drugs.  Allergies  Allergen Reactions  . Hydrocodone-Acetaminophen Shortness Of Breath  . Levofloxacin Shortness Of Breath  . Cephalexin Rash  . Ciprofloxacin Rash  . Codeine Rash  . Sulfamethoxazole-Trimethoprim Rash   Family History: reviewed with daughter at bedside. -twin sister with HTN and A. Fib -father with HTN and HLD -mother with alzheimer   Prior to Admission medications   Medication Sig Start Date End Date Taking? Authorizing Provider  Ascorbic Acid (VITAMIN C) 1000 MG tablet Take 1,000 mg by mouth daily.   Yes [provider]  aspirin 81 MG chewable tablet Chew 81 mg by mouth daily. 11/09/16 11/09/17 Yes [provider]  B Complex Vitamins (B COMPLEX PO) Take 1 tablet by mouth daily.   Yes [provider]  Cholecalciferol (VITAMIN D PO) Take 1 tablet by mouth daily.   Yes [provider]  CINNAMON PO Take 1,000 mg by mouth daily.   Yes [provider]  Coenzyme Q10 (CO Q 10 PO) Take 1 tablet by mouth daily.   Yes [provider]  CRANBERRY PO Take 1 tablet by mouth daily.   Yes [provider]  digoxin (LANOXIN) 0.125 MG tablet Take 125 mcg by mouth daily. 10/15/17  Yes [provider]  diltiazem (CARDIZEM CD) 120 MG 24 hr capsule Take 120 mg by mouth daily. For blood pressure 09/17/17  Yes [provider]  Docusate Calcium (STOOL SOFTENER PO) Take 1 tablet by mouth daily.   Yes [provider]  donepezil (ARICEPT) 10 MG tablet Take 10 mg by mouth every evening. 09/17/17  Yes [provider]  DULoxetine (CYMBALTA) 30 MG capsule Take 30 mg by mouth daily. 10/15/17  Yes [provider]  HUMALOG KWIKPEN 100 UNIT/ML KiwkPen Inject 2 Units into the skin 3 (three) times daily. BEFORE [facility] MEAL 08/20/17  Yes [provider]  hydrOXYzine (ATARAX/VISTARIL) 25 MG tablet Take 25 mg by mouth 3 (three) times daily as needed for anxiety. 09/17/17  Yes [provider]  LEVEMIR FLEXTOUCH 100 UNIT/ML Pen Inject 42 Units into the skin every evening. ADJUST AS NEEDED 08/20/17  Yes [provider]  levothyroxine (SYNTHROID, LEVOTHROID) 112 MCG tablet  Take 112 mcg by mouth daily. 09/16/17  Yes [provider]  meclizine (ANTIVERT) 25 MG tablet Take 25 mg by mouth 4 (four) times daily as needed for dizziness. 09/17/17  Yes [provider]  pantoprazole (PROTONIX) 40 MG tablet Take 40 mg by mouth 2 (two) times daily. 09/17/17  Yes [provider]  simvastatin (ZOCOR) 10 MG tablet Take 10 mg  by mouth every evening. 09/17/17  Yes [provider]    Physical Exam: Vitals:   11/05/17 1707 11/05/17 1730 11/05/17 1829 11/05/17 2012  BP: (!) 143/60 132/66 132/66 (!) 101/58  Pulse: 85 90 90 85  Resp: 16 (!) 26 (!) 26 (!) 23  Temp:    98.9 F (37.2 C)  TempSrc:    Oral  SpO2: 97% 95% 95% 96%  Weight:    89.2 kg (196 lb 10.4 oz)  Height:    5\' 3"  (1.6 m)    Constitutional: in no acute distress, some tachypnea and mild hypoxia with minimal exertion noted (repositioning in bed and talking), no CP, no nausea, no vomiting. Eyes: PERTLA, lids and conjunctivae normal, icterus ENMT: Mucous membranes moist. Posterior pharynx clear of any exudate or lesions. Fair dentition. No thrush. Neck: supple, no masses, no thyromegaly, no JVD Respiratory: positive rhonchi and mild expiratory wheezing, no using accessory muscles, no wearing oxygen at rest. positive tachypnea noted with minimal activity. Cardiovascular: S1 & S2 heard, regular rate and rhythm, no rubs / no gallops. Trace LE edema. 2+ pedal pulses. No carotid bruits.  Abdomen: No distension, no tenderness, no masses palpated. No hepatosplenomegaly. Bowel sounds normal.  Musculoskeletal: no clubbing / no cyanosis. No joint deformity upper and lower extremities. Good ROM, no contractures. Normal muscle tone.  Skin: no rashes, lesions, ulcers. No induration Neurologic: CN 2-12 grossly intact. Sensation intact,Strength 4/5 in all 4 limbs due to poor effort. Following commands appropriately.  Psychiatric: overall with poor insight, oriented X2, no SI or hallucinations.   Labs on Admission: I have personally reviewed following labs and imaging studies  CBC: Recent Labs  Lab 11/05/17 1043 11/05/17 1853  WBC 6.9 6.6  NEUTROABS 4.7  --   HGB 13.3 12.9  HCT 39.5 39.4  MCV 85.3 85.7  PLT 212 224   Basic Metabolic Panel: Recent Labs  Lab 11/05/17 1043 11/05/17 1853  NA 133*  --   K 3.6  --   CL 99*  --   CO2 24  --     GLUCOSE 281*  --   BUN 14  --   CREATININE 0.64 0.69  CALCIUM 9.4  --    Liver Function Tests: Recent Labs  Lab 11/05/17 1043  AST 30  ALT 25  ALKPHOS 94  BILITOT 0.3  PROT 7.3  ALBUMIN 3.8   Cardiac Enzymes: Recent Labs  Lab 11/05/17 1043  TROPONINI <0.03   Urine analysis:    Component Value Date/Time   COLORURINE AMBER (A) 11/05/2017 1454   APPEARANCEUR CLOUDY (A) 11/05/2017 1454   LABSPEC 1.023 11/05/2017 1454   PHURINE 5.0 11/05/2017 1454   GLUCOSEU >=500 (A) 11/05/2017 1454   HGBUR NEGATIVE 11/05/2017 1454   BILIRUBINUR NEGATIVE 11/05/2017 1454   KETONESUR 5 (A) 11/05/2017 1454   PROTEINUR 30 (A) 11/05/2017 1454   NITRITE NEGATIVE 11/05/2017 1454   LEUKOCYTESUR LARGE (A) 11/05/2017 1454    Radiological Exams on Admission: Dg Chest 2 View  Result Date: 11/05/2017 CLINICAL DATA:  Generalize weakness increasing over the past 2 days. The patient has  fallen several times over the past several days. EXAM: CHEST  2 VIEW COMPARISON:  None in PACs FINDINGS: The lungs are adequately inflated. There is no focal infiltrate. The lung markings are coarse in the retrocardiac region however. The heart is normal in size. There is a stent in the ascending aorta. There is calcification in the wall of the aortic arch and tortuosity of the descending thoracic aorta. There are postsurgical changes of both shoulders. There is degenerative disc disease of the lower thoracic spine. IMPRESSION: There is no CHF. Atelectasis or early pneumonia in the left lower lobe is not excluded. Correlation with any respiratory symptoms is needed. No CHF nor other acute cardiopulmonary abnormality. Thoracic aortic atherosclerosis. Electronically Signed   By: David  [place] M.D.   On: 11/05/2017 11:23   Ct Head Wo Contrast  Result Date: 11/05/2017 CLINICAL DATA:  EMS reports from home, family states repeated witnessed falls today. Generalized weakness x 2 days. Hx of dementia, diabetes, CAD. Denies pain.  EXAM: CT HEAD WITHOUT CONTRAST TECHNIQUE: Contiguous axial images were obtained from the base of the skull through the vertex without intravenous contrast. COMPARISON:  None. FINDINGS: Brain: Diffuse parenchymal atrophy. Patchy areas of hypoattenuation in deep and periventricular white matter bilaterally. Negative for acute intracranial hemorrhage, mass lesion, acute infarction, midline shift, or mass-effect. Acute infarct may be inapparent on noncontrast CT. Ventricles and sulci symmetric. Vascular: Atherosclerotic and physiologic intracranial calcifications. Skull: Normal. Negative for fracture or focal lesion. Sinuses/Orbits: No acute finding. Other: None. IMPRESSION: 1. Negative for bleed or other acute intracranial process. 2. Atrophy and nonspecific white matter changes. Electronically Signed   By: Corlis Leak M.D.   On: 11/05/2017 12:41    EKG:  No acute ischemic changes; prolonged PR, normal sinus rhythm.  Assessment/Plan 1-acute resp failure with hypoxia: due to CAP and influenza A -patient with borderline normal O2 sat at rest and and hypoxia into mid 80's with exertion. -CXR with LLL infiltrate unable to differentiate atelectasis from PNA. -influenza PCR positive  (influenza A) -patient admitted to med-surg, CURB 65 sore 2 -started on doxycycline for CAP (given allergy history), also started on tamiflu  -supportive care, blood cx, sputum cx, strep antigen and legionella in urine pending. -mucinex, flutter, valve, PRN oxygen supplementation and nebulizer therapy ordered. -will follow clinical response  -droplet precaution requested   2-Acute lower UTI: -UA suggesting UTI -patient's allergy hx making antibiotic therapy difficult  -culture ordered and pending -fosfomycin given  -no dysuria currently, reporting positive increase frequency   3-HTN (hypertension) -BP is well controlled currently  -continue current antihypertensive regimen   4-HLD (hyperlipidemia) -will continue  statins   5-Hypothyroidism -continue synthroid   6-Type 2 diabetes mellitus without complication (HCC) -SSI ordered -will also continue long acting, but at a adjusted dose  -modified carb diet requested   7-Depression -no SI or hallucinations currently  -continue cymbalta  8-hx of dementia -will continue using aricept -provide supportive care   9-Generalized weakness and fall -no acute abnormalities seen on CT head -PT/OT ordered for evaluation and recommendations    10-GERD -continue PPI  11-HLD -continue zocor  12-hx of PAF -rate controlled and sinus currently -continue diltiazem and digoxin -digoxin level ordered  -continue ASA for secondary prevention  -CHADsVASC score 2; not on anticoagulation given falling hx.   Time: 70 minutes.   DVT prophylaxis: Heparin  Code Status: Full code Family Communication: daughter at bedside  Disposition Plan: to be determine, but most likely discharge back home once medically  stable.  Consults called: none  Admission status: inpatient, LOS > 2 midnights, med-surg bed   Vassie Loll MD [facility] Pager [phone]  If 7PM-7AM, please contact night-coverage [email or web address] Password [email or web address]  11/05/2017, 9:22 PM

## 2017-11-06 DIAGNOSIS — R0902 Hypoxemia: Secondary | ICD-10-CM

## 2017-11-06 LAB — CBC
HCT: 38.4 % (ref 36.0–46.0)
HEMOGLOBIN: 13 g/dL (ref 12.0–15.0)
MCH: 29.1 pg (ref 26.0–34.0)
MCHC: 33.9 g/dL (ref 30.0–36.0)
MCV: 86.1 fL (ref 78.0–100.0)
Platelets: 214 10*3/uL (ref 150–400)
RBC: 4.46 MIL/uL (ref 3.87–5.11)
RDW: 15.2 % (ref 11.5–15.5)
WBC: 8.9 10*3/uL (ref 4.0–10.5)

## 2017-11-06 LAB — URINE CULTURE

## 2017-11-06 LAB — GLUCOSE, CAPILLARY
GLUCOSE-CAPILLARY: 242 mg/dL — AB (ref 65–99)
GLUCOSE-CAPILLARY: 220 mg/dL — AB (ref 65–99)
Glucose-Capillary: 133 mg/dL — ABNORMAL HIGH (ref 65–99)
Glucose-Capillary: 195 mg/dL — ABNORMAL HIGH (ref 65–99)

## 2017-11-06 LAB — BASIC METABOLIC PANEL
ANION GAP: 9 (ref 5–15)
BUN: 14 mg/dL (ref 6–20)
CALCIUM: 8.8 mg/dL — AB (ref 8.9–10.3)
CO2: 25 mmol/L (ref 22–32)
Chloride: 101 mmol/L (ref 101–111)
Creatinine, Ser: 0.7 mg/dL (ref 0.44–1.00)
Glucose, Bld: 265 mg/dL — ABNORMAL HIGH (ref 65–99)
Potassium: 4.1 mmol/L (ref 3.5–5.1)
Sodium: 135 mmol/L (ref 135–145)

## 2017-11-06 LAB — C DIFFICILE QUICK SCREEN W PCR REFLEX
C Diff antigen: NEGATIVE
C Diff interpretation: NOT DETECTED
C Diff toxin: NEGATIVE

## 2017-11-06 LAB — STREP PNEUMONIAE URINARY ANTIGEN: STREP PNEUMO URINARY ANTIGEN: NEGATIVE

## 2017-11-06 LAB — HIV ANTIBODY (ROUTINE TESTING W REFLEX): HIV SCREEN 4TH GENERATION: NONREACTIVE

## 2017-11-06 MED ORDER — INSULIN DETEMIR 100 UNIT/ML ~~LOC~~ SOLN
40.0000 [IU] | Freq: Every day | SUBCUTANEOUS | Status: DC
  Administered 2017-11-06: 40 [IU] via SUBCUTANEOUS
  Filled 2017-11-06 (×2): qty 0.4

## 2017-11-06 NOTE — Progress Notes (Signed)
Inpatient Diabetes Program Recommendations  AACE/ADA: New Consensus Statement on Inpatient Glycemic Control (2015)  Target Ranges:  Prepandial:   less than 140 mg/dL      Peak postprandial:   less than 180 mg/dL (1-2 hours)      Critically ill patients:  140 - 180 mg/dL   Lab Results  Component Value Date   GLUCAP 242 (H) 11/06/2017   HGBA1C 11.8 (H) 11/05/2017    Review of Glycemic Control  Diabetes history: DM2 Outpatient Diabetes medications: Levemir 42 units QHS, Humalog 2 units tidwc Current orders for Inpatient glycemic control: Levemir 30 units QHS, Novolog 0-15 units tidwc  HgbA1C of 11.8% indicates poor glycemic control PTA. Will speak with pt 's family regarding her diabetes control and monitoring blood sugars.  Inpatient Diabetes Program Recommendations:     Increase Levemir to 36 units QHS Add HS correction If post-prandial blood sugars > 180 mg/dL, add Novolog 2 units tidwc.  Will follow.  Thank you. Roberta Anderson, RD, LDN, CDE Inpatient Diabetes Coordinator [phone]

## 2017-11-06 NOTE — Progress Notes (Signed)
PROGRESS NOTE    Roberta Anderson  ZOX:[id number] DOB: Oct 16, 1933 DOA: 11/05/2017 PCP: Magdalene River, MD   Brief Narrative: Patient is a 84 year old female with past medical history of dementia, paroxysmal A. fib, hypertension, hyperlipidemia, diabetes mellitus on insulin to the emergency department with complaints of generalized weakness, falls, nonproductive cough and shortness of breath.  Patient also reported of increased frequency of urination, chills.  Patient was found to be hypoxic on presentation.  Chest x-ray was history of pneumonia and urinalysis was concerning for UTI.  Influenza A is also found to be positive.  Assessment & Plan:   Principal Problem:   Hypoxia Active Problems:   Acute lower UTI   CAP (community acquired pneumonia)   HTN (hypertension)   HLD (hyperlipidemia)   Hypothyroidism   Type 2 diabetes mellitus without complication (HCC)   Depression   Generalized weakness  Acute respiratory failure with hypoxia: Respiratory status much stable now. Chest x-ray was positive left lower lobe infiltrate versus atelectasis.  Influenza was positive. Continue antibiotics.  On doxycycline now.  Also started on Tamiflu. Will Follow-up blood culture.-Follow-up urine stable antigen and Legionella. Continue supplemental oxygen as needed, bronchodilators Continue droplet precaution.  Urine tract infection:  UA suggestive of UTI.  Treated with a dose of fosfomycin.  We will follow-up urine culture.  Influenza A: Continue Tamiflu  Hypertension: Stable.  Continue current regimen  Hyperlipidemia: Continue statin  Hypothyroidism: Continue Synthroid  Diabetes mellitus type 2: Continue sliding scale insulin and long-acting insulin.  We will continue to monitor her blood sugars. Her hemoglobin A1c has been noted to be 11.8. Probably she is noncompliant  Depression: Continue Cymbalta  History of dementia: On Aricept at home.  We will continue it.  Continue supportive  care  Generalized weakness/fall: CT head did not show any acute intracranial abnormalities.  PT/OT ordered.  History of paroxysmal A. fib: Currently rate is controlled.  On Cardizem and digoxin.  Continue aspirin.CHADsVASC score 2; not on anticoagulation given falling hx     DVT prophylaxis:  heparin Code Status: Full Family Communication: None present at the bedside Disposition Plan: Home in 1-2 days   Consultants: None  Procedures:None  Antimicrobials: Doxycycline since 11/05/17                            Tamiflu since 11/05/17                             Fosfomycin given 1 dose on 11/05/17  Subjective: Patient seen and examined at bedside this morning.  Has been afebrile now.  Vitals are stable.  Complaint of 2 episodes of diarrhea this morning.  Denies any chest pain.  Overall status improving.  Objective: Vitals:   11/05/17 1730 11/05/17 1829 11/05/17 2012 11/06/17 0554  BP: 132/66 132/66 (!) 101/58 (!) 152/58  Pulse: 90 90 85 96  Resp: (!) 26 (!) 26 (!) 23 20  Temp:   98.9 F (37.2 C) 100.3 F (37.9 C)  TempSrc:   Oral Oral  SpO2: 95% 95% 96% 93%  Weight:   89.2 kg (196 lb 10.4 oz)   Height:   5\' 3"  (1.6 m)     Intake/Output Summary (Last 24 hours) at 11/06/2017 1319 Last data filed at 11/06/2017 0600 Gross per 24 hour  Intake 1231.25 ml  Output 20 ml  Net 1211.25 ml   Filed Weights   11/05/17 0946  11/05/17 2012  Weight: 79.4 kg (175 lb) 89.2 kg (196 lb 10.4 oz)    Examination:  General exam: Appears calm and comfortable ,Not in distress,obese Respiratory system: Bilateral decreased air entry, bilateral coarse breathing sounds  cardiovascular system: S1 & S2 heard, RRR. No JVD, murmurs, rubs, gallops or clicks. No pedal edema. Gastrointestinal system: Abdomen is nondistended, soft and nontender. No organomegaly or masses felt. Normal bowel sounds heard. Central nervous system: Alert and oriented. No focal neurological deficits. Extremities: No edema, no  clubbing ,no cyanosis, distal peripheral pulses palpable. Skin: No rashes, lesions or ulcers,no icterus ,no pallor Psychiatry: Judgement and insight appear normal. Mood & affect appropriate.     Data Reviewed: I have personally reviewed following labs and imaging studies  CBC: Recent Labs  Lab 11/05/17 1043 11/05/17 1853 11/06/17 0653  WBC 6.9 6.6 8.9  NEUTROABS 4.7  --   --   HGB 13.3 12.9 13.0  HCT 39.5 39.4 38.4  MCV 85.3 85.7 86.1  PLT 212 224 214   Basic Metabolic Panel: Recent Labs  Lab 11/05/17 1043 11/05/17 1853 11/06/17 0653  NA 133*  --  135  K 3.6  --  4.1  CL 99*  --  101  CO2 24  --  25  GLUCOSE 281*  --  265*  BUN 14  --  14  CREATININE 0.64 0.69 0.70  CALCIUM 9.4  --  8.8*   GFR: Estimated Creatinine Clearance: 58.4 mL/min (by C-G formula based on SCr of 0.7 mg/dL). Liver Function Tests: Recent Labs  Lab 11/05/17 1043  AST 30  ALT 25  ALKPHOS 94  BILITOT 0.3  PROT 7.3  ALBUMIN 3.8   No results for input(s): LIPASE, AMYLASE in the last 168 hours. No results for input(s): AMMONIA in the last 168 hours. Coagulation Profile: No results for input(s): INR, PROTIME in the last 168 hours. Cardiac Enzymes: Recent Labs  Lab 11/05/17 1043  TROPONINI <0.03   BNP (last 3 results) No results for input(s): PROBNP in the last 8760 hours. HbA1C: Recent Labs    11/05/17 1853  HGBA1C 11.8*   CBG: Recent Labs  Lab 11/05/17 2238 11/06/17 0757 11/06/17 1157  GLUCAP 268* 242* 220*   Lipid Profile: No results for input(s): CHOL, HDL, LDLCALC, TRIG, CHOLHDL, LDLDIRECT in the last 72 hours. Thyroid Function Tests: Recent Labs    11/05/17 1043  TSH 0.965   Anemia Panel: No results for input(s): VITAMINB12, FOLATE, FERRITIN, TIBC, IRON, RETICCTPCT in the last 72 hours. Sepsis Labs: No results for input(s): PROCALCITON, LATICACIDVEN in the last 168 hours.  Recent Results (from the past 240 hour(s))  Culture, blood (routine x 2) Call MD if  unable to obtain prior to antibiotics being given     Status: None (Preliminary result)   Collection Time: 11/05/17  6:45 PM  Result Value Ref Range Status   Specimen Description BLOOD RIGHT ANTECUBITAL  Final   Special Requests   Final    BOTTLES DRAWN AEROBIC AND ANAEROBIC Blood Culture adequate volume   Culture   Final    NO GROWTH < 12 HOURS Performed at [facility], [address] [place], [place] [address]    Report Status PENDING  Incomplete  Culture, blood (routine x 2) Call MD if unable to obtain prior to antibiotics being given     Status: None (Preliminary result)   Collection Time: 11/05/17  8:08 PM  Result Value Ref Range Status   Specimen Description BLOOD RIGHT  ANTECUBITAL  Final   Special Requests   Final    BOTTLES DRAWN AEROBIC AND ANAEROBIC Blood Culture adequate volume   Culture   Final    NO GROWTH < 12 HOURS Performed at [facility], [address] [place], [place] [address]    Report Status PENDING  Incomplete         Radiology Studies: Dg Chest 2 View  Result Date: 11/05/2017 CLINICAL DATA:  Generalize weakness increasing over the past 2 days. The patient has fallen several times over the past several days. EXAM: CHEST  2 VIEW COMPARISON:  None in PACs FINDINGS: The lungs are adequately inflated. There is no focal infiltrate. The lung markings are coarse in the retrocardiac region however. The heart is normal in size. There is a stent in the ascending aorta. There is calcification in the wall of the aortic arch and tortuosity of the descending thoracic aorta. There are postsurgical changes of both shoulders. There is degenerative disc disease of the lower thoracic spine. IMPRESSION: There is no CHF. Atelectasis or early pneumonia in the left lower lobe is not excluded. Correlation with any respiratory symptoms is needed. No CHF nor other acute cardiopulmonary abnormality. Thoracic aortic atherosclerosis. Electronically Signed   By: David  [place]azil[place]Jordan  M.D.   On: 11/05/2017 11:23   Ct Head Wo Contrast  Result Date: 11/05/2017 CLINICAL DATA:  EMS reports from home, family states repeated witnessed falls today. Generalized weakness x 2 days. Hx of dementia, diabetes, CAD. Denies pain. EXAM: CT HEAD WITHOUT CONTRAST TECHNIQUE: Contiguous axial images were obtained from the base of the skull through the vertex without intravenous contrast. COMPARISON:  None. FINDINGS: Brain: Diffuse parenchymal atrophy. Patchy areas of hypoattenuation in deep and periventricular white matter bilaterally. Negative for acute intracranial hemorrhage, mass lesion, acute infarction, midline shift, or mass-effect. Acute infarct may be inapparent on noncontrast CT. Ventricles and sulci symmetric. Vascular: Atherosclerotic and physiologic intracranial calcifications. Skull: Normal. Negative for fracture or focal lesion. Sinuses/Orbits: No acute finding. Other: None. IMPRESSION: 1. Negative for bleed or other acute intracranial process. 2. Atrophy and nonspecific white matter changes. Electronically Signed   By: Corlis Leak  Hassell M.D.   On: 11/05/2017 12:41        Scheduled Meds: . aspirin  81 mg Oral Daily  . dextromethorphan-guaiFENesin  1 tablet Oral BID  . digoxin  125 mcg Oral Daily  . diltiazem  120 mg Oral Daily  . donepezil  10 mg Oral QPM  . DULoxetine  30 mg Oral Daily  . heparin  5,000 Units Subcutaneous Q8H  . insulin aspart  0-15 Units Subcutaneous TID WC  . insulin detemir  30 Units Subcutaneous QHS  . levothyroxine  112 mcg Oral QAC breakfast  . oseltamivir  75 mg Oral BID  . pantoprazole  40 mg Oral BID  . simvastatin  10 mg Oral QPM  . vitamin C  1,000 mg Oral Daily   Continuous Infusions: . doxycycline (VIBRAMYCIN) IV Stopped (11/06/17 0800)     LOS: 1 day    Time spent: 25 mins    Lukis Bunt Salli QuarryAdhikari BK, MD [facility] Pager [phone]  If 7PM-7AM, please contact night-coverage [email or web address] Password [email or web address] 11/06/2017, 1:19 PM

## 2017-11-06 NOTE — Evaluation (Signed)
Physical Therapy Evaluation Patient Details Name: Roberta Anderson MRN: [id number] DOB: Dec 30, 1933 Today's Date: 11/06/2017   History of Present Illness  84 yo female admitted with hypoxia, + flu, UTI, falls. Hx of dementia, PAF, DM  Clinical Impression  On eval, pt required Min assist for mobility. She walked ~75 feet with a RW. Pt presents with general weakness, decreased activity tolerance, and impaired gait and balance. Daughter and son n law present during session. Discussed d/c plan-pt will return home where she lives with her sister. Recommend HHPT, HHOT, and RW use for safe ambulation. Will follow and progress activity during hospital stay.     Follow Up Recommendations Home health PT;Home Health OT;Supervision/Assistance - 24 hour; Home Health Aide (if possible)    Equipment Recommendations  None recommended by PT (family stated pt has a walker at home)    Recommendations for Other Services OT consult     Precautions / Restrictions Precautions Precautions: Fall Precaution Comments: droplet Restrictions Weight Bearing Restrictions: No      Mobility  Bed Mobility Overal bed mobility: Needs Assistance Bed Mobility: Supine to Sit     Supine to sit: Min assist     General bed mobility comments: Assist to get to EOB. Increased time.   Transfers Overall transfer level: Needs assistance Equipment used: Rolling walker (2 wheeled) Transfers: Sit to/from Stand Sit to Stand: Min assist         General transfer comment: Assist to rise, stabilize, control descent. VCs safety, hand placement.   Ambulation/Gait Ambulation/Gait assistance: Min guard Ambulation Distance (Feet): 75 Feet Assistive device: Rolling walker (2 wheeled) Gait Pattern/deviations: Step-through pattern;Decreased stride length     General Gait Details: close guard for safety. Pt is not familiar with using a RW. She tolerated distance well. c/o legs feeling weak.   Stairs             Wheelchair Mobility    Modified Rankin (Stroke Patients Only)       Balance Overall balance assessment: Needs assistance           Standing balance-Leahy Scale: Fair                               Pertinent Vitals/Pain Pain Assessment: No/denies pain    Home Living Family/patient expects to be discharged to:: Private residence Living Arrangements: Other relatives(sister) Available Help at Discharge: Family Type of Home: House Home Access: Stairs to enter   [employer] of Steps: 1 Home Layout: One level Home Equipment: Environmental consultant - 2 wheels;Cane - single point      Prior Function Level of Independence: Independent               Hand Dominance        Extremity/Trunk Assessment   Upper Extremity Assessment Upper Extremity Assessment: Generalized weakness    Lower Extremity Assessment Lower Extremity Assessment: Generalized weakness    Cervical / Trunk Assessment Cervical / Trunk Assessment: Normal  Communication   Communication: No difficulties  Cognition Arousal/Alertness: Awake/alert Behavior During Therapy: WFL for tasks assessed/performed Overall Cognitive Status: History of cognitive impairments - at baseline Area of Impairment: Memory                                      General Comments      Exercises     Assessment/Plan  PT Assessment Patient needs continued PT services  PT Problem List Decreased strength;Decreased balance;Decreased mobility;Decreased activity tolerance;Decreased cognition;Decreased knowledge of use of DME       PT Treatment Interventions Gait training;DME instruction;Functional mobility training;Therapeutic activities;Balance training;Patient/family education;Therapeutic exercise    PT Goals (Current goals can be found in the Care Plan section)  Acute Rehab PT Goals Patient Stated Goal: back home with her sister (per pt and family) PT Goal Formulation: With  patient/family Time For Goal Achievement: 11/20/17 Potential to Achieve Goals: Good    Frequency Min 3X/week   Barriers to discharge        Co-evaluation               AM-PAC PT "6 Clicks" Daily Activity  Outcome Measure Difficulty turning over in bed (including adjusting bedclothes, sheets and blankets)?: A Little Difficulty moving from lying on back to sitting on the side of the bed? : Unable Difficulty sitting down on and standing up from a chair with arms (e.g., wheelchair, bedside commode, etc,.)?: A Little Help needed moving to and from a bed to chair (including a wheelchair)?: A Little Help needed walking in hospital room?: A Little Help needed climbing 3-5 steps with a railing? : A Little 6 Click Score: 16    End of Session Equipment Utilized During Treatment: Gait belt Activity Tolerance: Patient tolerated treatment well Patient left: in bed;with call bell/phone within reach;with bed alarm set;with family/visitor present   PT Visit Diagnosis: Muscle weakness (generalized) (M62.81);Difficulty in walking, not elsewhere classified (R26.2)    Time: [id number] PT Time Calculation (min) (ACUTE ONLY): 25 min   Charges:   PT Evaluation $PT Eval Moderate Complexity: 1 Mod PT Treatments $Gait Training: 8-22 mins   PT G Codes:          Rebeca AlertJannie Curley Hogen, MPT Pager: [phone]

## 2017-11-06 NOTE — Progress Notes (Signed)
Per [facility] rep pt was active with them for RN and CSW. Will need MD orders to resume if still needed at discharge. Sandford Crazeora Romell Cavanah RN,BSN,NCM [phone]

## 2017-11-07 LAB — GLUCOSE, CAPILLARY
Glucose-Capillary: 295 mg/dL — ABNORMAL HIGH (ref 65–99)
Glucose-Capillary: 278 mg/dL — ABNORMAL HIGH (ref 65–99)

## 2017-11-07 LAB — LEGIONELLA PNEUMOPHILA SEROGP 1 UR AG: L. PNEUMOPHILA SEROGP 1 UR AG: NEGATIVE

## 2017-11-07 MED ORDER — DM-GUAIFENESIN ER 30-600 MG PO TB12: 1.0000 | ORAL_TABLET | Freq: Two times a day (BID) | ORAL | 0 refills | Status: AC

## 2017-11-07 MED ORDER — DOXYCYCLINE HYCLATE 100 MG PO TABS: 100.0000 mg | ORAL_TABLET | Freq: Two times a day (BID) | ORAL | 0 refills | Status: DC

## 2017-11-07 MED ORDER — DONEPEZIL HCL 10 MG PO TABS: 10.0000 mg | ORAL_TABLET | Freq: Every day | ORAL | Status: DC

## 2017-11-07 MED ORDER — OSELTAMIVIR PHOSPHATE 75 MG PO CAPS: 75.0000 mg | ORAL_CAPSULE | Freq: Two times a day (BID) | ORAL | 0 refills | Status: DC

## 2017-11-07 NOTE — Progress Notes (Signed)
Physical Therapy Treatment Patient Details Name: Roberta Anderson MRN: [id number] DOB: 07-30-1934 Today's Date: 11/07/2017    History of Present Illness 84 yo female admitted with hypoxia, + flu, UTI, falls. Hx of dementia, PAF, DM    PT Comments    Progressing with mobility. Pt continues to report not feeling well. She is also having diarrhea still.    Follow Up Recommendations  Home health PT; Home health OT; Supervision/Assistance - 24 hour     Equipment Recommendations  None recommended by PT    Recommendations for Other Services       Precautions / Restrictions Precautions Precautions: Fall Precaution Comments: droplet; diarrhea Restrictions Weight Bearing Restrictions: No    Mobility  Bed Mobility Overal bed mobility: Needs Assistance Bed Mobility: Supine to Sit     Supine to sit: Supervision     General bed mobility comments: for safety. Pt used bedrail.   Transfers Overall transfer level: Needs assistance Equipment used: Rolling walker (2 wheeled) Transfers: Sit to/from Stand Sit to Stand: Min guard         General transfer comment: close guard for safety. VCs hand placement.   Ambulation/Gait Ambulation/Gait assistance: Min guard Ambulation Distance (Feet): 150 Feet(15'x1) Assistive device: Rolling walker (2 wheeled);None Gait Pattern/deviations: Step-through pattern;Decreased stride length     General Gait Details: close guard for safety. Pt walked in hallway first with RW. She then walked from bathroom to recliner without an assistive device.    Stairs            Wheelchair Mobility    Modified Rankin (Stroke Patients Only)       Balance Overall balance assessment: Needs assistance           Standing balance-Leahy Scale: Fair Standing balance comment: Pt able to perform toilet hygiene and stand at sink unsupported without a LOB.                             Cognition Arousal/Alertness: Awake/alert Behavior  During Therapy: WFL for tasks assessed/performed Overall Cognitive Status: History of cognitive impairments - at baseline Area of Impairment: Memory                                      Exercises      General Comments        Pertinent Vitals/Pain Pain Assessment: No/denies pain    Home Living                      Prior Function            PT Goals (current goals can now be found in the care plan section) Progress towards PT goals: Progressing toward goals    Frequency    Min 3X/week      PT Plan Current plan remains appropriate    Co-evaluation              AM-PAC PT "6 Clicks" Daily Activity  Outcome Measure  Difficulty turning over in bed (including adjusting bedclothes, sheets and blankets)?: None Difficulty moving from lying on back to sitting on the side of the bed? : A Little Difficulty sitting down on and standing up from a chair with arms (e.g., wheelchair, bedside commode, etc,.)?: A Little Help needed moving to and from a bed to chair (including a wheelchair)?: A Little Help needed walking  in hospital room?: A Little Help needed climbing 3-5 steps with a railing? : A Lot 6 Click Score: 18    End of Session Equipment Utilized During Treatment: Gait belt Activity Tolerance: Patient tolerated treatment well Patient left: in chair;with call bell/phone within reach;with chair alarm set   PT Visit Diagnosis: Muscle weakness (generalized) (M62.81);Difficulty in walking, not elsewhere classified (R26.2)     Time: 1610-9604 PT Time Calculation (min) (ACUTE ONLY): 28 min  Charges:  $Gait Training: 23-37 mins                    G Codes:       Rebeca Alert, MPT Pager: [phone]

## 2017-11-07 NOTE — Discharge Summary (Signed)
Physician Discharge Summary  Roberta Anderson JXB:[id number] DOB: 12-01-1933 DOA: 11/05/2017  PCP: Magdalene River, MD  Admit date: 11/05/2017 Discharge date: 11/07/2017  Admitted From: Home  Disposition:  Home   Home Health: Yes  Discharge Condition: Stable CODE STATUS:Full Diet recommendation: Heart Healthy  Brief/Interim Summary:  Patient is a 84 year old female with past medical history of dementia, paroxysmal A. fib, hypertension, hyperlipidemia, diabetes mellitus on insulin to the emergency department with complaints of generalized weakness, falls, nonproductive cough and shortness of breath.  Patient also reported of increased frequency of urination, chills.  Patient was found to be hypoxic on presentation.  Chest x-ray was suggestive of questionable  pneumonia and urinalysis was also concerning for UTI.  Influenza A is also found to be positive. Patient was started on antibiotics.  Her general overall condition gradually improved.  Her blood cultures have been negative.  Urine culture grew mixed organisms. She looks much better today.  Her vitals are stable.  Labs are fine.  She will be discharged home today. She will follow-up with her PCP in a week.  Following problems were addressed during hospitalization:  Acute respiratory failure with hypoxia: Respiratory status much stable now. Chest x-ray was positive left lower lobe infiltrate versus atelectasis.  Influenza was positive. Continue antibiotics.  On doxycycline now.  Also started on Tamiflu. Blood culture NGTD.  Urine tract infection:  UA suggestive of UTI.  Treated with a dose of fosfomycin.  Urine culture grew mixed organism  Influenza A: Continue Tamiflu to complete the course at home.  Hypertension: Stable.  Continue current regimen  Hyperlipidemia: Continue statin  Hypothyroidism: Continue Synthroid  Diabetes mellitus type 2: Continue home regimen on discharge. Her hemoglobin A1c has been noted to be  11.8. Probably she is non compliant She needs close follow-up with her PCP for management of her diabetes.  Depression: Continue Cymbalta  History of dementia: On Aricept at home.  We will continue it.  Continue supportive care  Generalized weakness/fall: CT head did not show any acute intracranial abnormalities.  PT/OT ordered.  History of paroxysmal A. fib: Currently rate is controlled.  On Cardizem and digoxin. Continue aspirin.CHADsVASC score 2; not on anticoagulation given falling      Discharge Diagnoses:  Principal Problem:   Hypoxia Active Problems:   Acute lower UTI   CAP (community acquired pneumonia)   HTN (hypertension)   HLD (hyperlipidemia)   Hypothyroidism   Type 2 diabetes mellitus without complication (HCC)   Depression   Generalized weakness    Discharge Instructions  Discharge Instructions    Diet - low sodium heart healthy   Complete by:  As directed    Discharge instructions   Complete by:  As directed    1) Follow up with you PCP in a week. 2) Take prescribed medications as instructed.   Increase activity slowly   Complete by:  As directed      Allergies as of 11/07/2017      Reactions   Hydrocodone-acetaminophen Shortness Of Breath   Levofloxacin Shortness Of Breath   Cephalexin Rash   Ciprofloxacin Rash   Codeine Rash   Sulfamethoxazole-trimethoprim Rash      Medication List    TAKE these medications   aspirin 81 MG chewable tablet Chew 81 mg by mouth daily.   B COMPLEX PO Take 1 tablet by mouth daily.   CINNAMON PO Take 1,000 mg by mouth daily.   CO Q 10 PO Take 1 tablet by mouth daily.  CRANBERRY PO Take 1 tablet by mouth daily.   dextromethorphan-guaiFENesin 30-600 MG 12hr tablet Commonly known as:  MUCINEX DM Take 1 tablet by mouth 2 (two) times daily.   digoxin 0.125 MG tablet Commonly known as:  LANOXIN Take 125 mcg by mouth daily.   diltiazem 120 MG 24 hr capsule Commonly known as:  CARDIZEM CD Take 120  mg by mouth daily. For blood pressure   donepezil 10 MG tablet Commonly known as:  ARICEPT Take 10 mg by mouth every evening.   doxycycline 100 MG tablet Commonly known as:  VIBRA-TABS Take 1 tablet (100 mg total) by mouth 2 (two) times daily.   DULoxetine 30 MG capsule Commonly known as:  CYMBALTA Take 30 mg by mouth daily.   HUMALOG KWIKPEN 100 UNIT/ML KiwkPen Generic drug:  insulin lispro Inject 2 Units into the skin 3 (three) times daily. BEFORE EACH MEAL   hydrOXYzine 25 MG tablet Commonly known as:  ATARAX/VISTARIL Take 25 mg by mouth 3 (three) times daily as needed for anxiety.   LEVEMIR FLEXTOUCH 100 UNIT/ML Pen Generic drug:  Insulin Detemir Inject 42 Units into the skin every evening. ADJUST AS NEEDED   levothyroxine 112 MCG tablet Commonly known as:  SYNTHROID, LEVOTHROID Take 112 mcg by mouth daily.   meclizine 25 MG tablet Commonly known as:  ANTIVERT Take 25 mg by mouth 4 (four) times daily as needed for dizziness.   oseltamivir 75 MG capsule Commonly known as:  TAMIFLU Take 1 capsule (75 mg total) by mouth 2 (two) times daily.   pantoprazole 40 MG tablet Commonly known as:  PROTONIX Take 40 mg by mouth 2 (two) times daily.   simvastatin 10 MG tablet Commonly known as:  ZOCOR Take 10 mg by mouth every evening.   STOOL SOFTENER PO Take 1 tablet by mouth daily.   vitamin C 1000 MG tablet Take 1,000 mg by mouth daily.   VITAMIN D PO Take 1 tablet by mouth daily.      Follow-up Information    Magdalene River, MD. Schedule an appointment as soon as possible for a visit in 1 week(s).   Specialty:  Internal Medicine Contact information: [address] [facility] [place] [address] [phone]          Allergies  Allergen Reactions  . Hydrocodone-Acetaminophen Shortness Of Breath  . Levofloxacin Shortness Of Breath  . Cephalexin Rash  . Ciprofloxacin Rash  . Codeine Rash  . Sulfamethoxazole-Trimethoprim Rash     Consultations:  None   Procedures/Studies: Dg Chest 2 View  Result Date: 11/05/2017 CLINICAL DATA:  Generalize weakness increasing over the past 2 days. The patient has fallen several times over the past several days. EXAM: CHEST  2 VIEW COMPARISON:  None in PACs FINDINGS: The lungs are adequately inflated. There is no focal infiltrate. The lung markings are coarse in the retrocardiac region however. The heart is normal in size. There is a stent in the ascending aorta. There is calcification in the wall of the aortic arch and tortuosity of the descending thoracic aorta. There are postsurgical changes of both shoulders. There is degenerative disc disease of the lower thoracic spine. IMPRESSION: There is no CHF. Atelectasis or early pneumonia in the left lower lobe is not excluded. Correlation with any respiratory symptoms is needed. No CHF nor other acute cardiopulmonary abnormality. Thoracic aortic atherosclerosis. Electronically Signed   By: David  [place] M.D.   On: 11/05/2017 11:23   Ct Head Wo Contrast  Result Date: 11/05/2017  CLINICAL DATA:  EMS reports from home, family states repeated witnessed falls today. Generalized weakness x 2 days. Hx of dementia, diabetes, CAD. Denies pain. EXAM: CT HEAD WITHOUT CONTRAST TECHNIQUE: Contiguous axial images were obtained from the base of the skull through the vertex without intravenous contrast. COMPARISON:  None. FINDINGS: Brain: Diffuse parenchymal atrophy. Patchy areas of hypoattenuation in deep and periventricular white matter bilaterally. Negative for acute intracranial hemorrhage, mass lesion, acute infarction, midline shift, or mass-effect. Acute infarct may be inapparent on noncontrast CT. Ventricles and sulci symmetric. Vascular: Atherosclerotic and physiologic intracranial calcifications. Skull: Normal. Negative for fracture or focal lesion. Sinuses/Orbits: No acute finding. Other: None. IMPRESSION: 1. Negative for bleed or other acute  intracranial process. 2. Atrophy and nonspecific white matter changes. Electronically Signed   By: Corlis Leak  Hassell M.D.   On: 11/05/2017 12:41       Subjective: Patient seen and examined the bedside this morning.  Looks much better today.  Afebrile.  Respiratory status stable.  Denies any urinary symptoms.  Diarrhea has stopped.  She is alert and oriented x4 today.  Discharge Exam: Vitals:   11/06/17 2045 11/07/17 0517  BP: 139/60 132/71  Pulse: 71 61  Resp: 16 16  Temp: 97.7 F (36.5 C) 97.9 F (36.6 C)  SpO2: 94% 91%   Vitals:   11/06/17 0554 11/06/17 1554 11/06/17 2045 11/07/17 0517  BP: (!) 152/58 124/64 139/60 132/71  Pulse: 96 75 71 61  Resp: 20 18 16 16   Temp: 100.3 F (37.9 C) 99.1 F (37.3 C) 97.7 F (36.5 C) 97.9 F (36.6 C)  TempSrc: Oral Oral Oral Oral  SpO2: 93% 93% 94% 91%  Weight:      Height:        General: Pt is alert, awake, not in acute distress,obese Cardiovascular: RRR, S1/S2 +, no rubs, no gallops Respiratory: CTA bilaterally, no wheezing, no rhonchi Abdominal: Soft, NT, ND, bowel sounds + Extremities: no edema, no cyanosis    The results of significant diagnostics from this hospitalization (including imaging, microbiology, ancillary and laboratory) are listed below for reference.     Microbiology: Recent Results (from the past 240 hour(s))  Urine culture     Status: Abnormal   Collection Time: 11/05/17  2:54 PM  Result Value Ref Range Status   Specimen Description URINE, RANDOM  Final   Special Requests NONE  Final   Culture MULTIPLE SPECIES PRESENT, SUGGEST RECOLLECTION (A)  Final   Report Status 11/06/2017 FINAL  Final  Culture, blood (routine x 2) Call MD if unable to obtain prior to antibiotics being given     Status: None (Preliminary result)   Collection Time: 11/05/17  6:45 PM  Result Value Ref Range Status   Specimen Description BLOOD RIGHT ANTECUBITAL  Final   Special Requests   Final    BOTTLES DRAWN AEROBIC AND ANAEROBIC Blood  Culture adequate volume   Culture   Final    NO GROWTH < 12 HOURS Performed at [facility], [address] [place], [place] [address]    Report Status PENDING  Incomplete  Culture, blood (routine x 2) Call MD if unable to obtain prior to antibiotics being given     Status: None (Preliminary result)   Collection Time: 11/05/17  8:08 PM  Result Value Ref Range Status   Specimen Description BLOOD RIGHT ANTECUBITAL  Final   Special Requests   Final    BOTTLES DRAWN AEROBIC AND ANAEROBIC Blood Culture adequate volume   Culture  Final    NO GROWTH < 12 HOURS Performed at [facility], [address], [place], [place] [address]    Report Status PENDING  Incomplete  C difficile quick scan w PCR reflex     Status: None   Collection Time: 11/06/17  6:45 PM  Result Value Ref Range Status   C Diff antigen NEGATIVE NEGATIVE Final   C Diff toxin NEGATIVE NEGATIVE Final   C Diff interpretation No C. difficile detected.  Final     Labs: BNP (last 3 results) No results for input(s): BNP in the last 8760 hours. Basic Metabolic Panel: Recent Labs  Lab 11/05/17 1043 11/05/17 1853 11/06/17 0653  NA 133*  --  135  K 3.6  --  4.1  CL 99*  --  101  CO2 24  --  25  GLUCOSE 281*  --  265*  BUN 14  --  14  CREATININE 0.64 0.69 0.70  CALCIUM 9.4  --  8.8*   Liver Function Tests: Recent Labs  Lab 11/05/17 1043  AST 30  ALT 25  ALKPHOS 94  BILITOT 0.3  PROT 7.3  ALBUMIN 3.8   No results for input(s): LIPASE, AMYLASE in the last 168 hours. No results for input(s): AMMONIA in the last 168 hours. CBC: Recent Labs  Lab 11/05/17 1043 11/05/17 1853 11/06/17 0653  WBC 6.9 6.6 8.9  NEUTROABS 4.7  --   --   HGB 13.3 12.9 13.0  HCT 39.5 39.4 38.4  MCV 85.3 85.7 86.1  PLT 212 224 214   Cardiac Enzymes: Recent Labs  Lab 11/05/17 1043  TROPONINI <0.03   BNP: Invalid input(s): POCBNP CBG: Recent Labs  Lab 11/06/17 0757 11/06/17 1157 11/06/17 1754 11/06/17 2232  11/07/17 0755  GLUCAP 242* 220* 133* 195* 295*   D-Dimer No results for input(s): DDIMER in the last 72 hours. Hgb A1c Recent Labs    11/05/17 1853  HGBA1C 11.8*   Lipid Profile No results for input(s): CHOL, HDL, LDLCALC, TRIG, CHOLHDL, LDLDIRECT in the last 72 hours. Thyroid function studies Recent Labs    11/05/17 1043  TSH 0.965   Anemia work up No results for input(s): VITAMINB12, FOLATE, FERRITIN, TIBC, IRON, RETICCTPCT in the last 72 hours. Urinalysis    Component Value Date/Time   COLORURINE AMBER (A) 11/05/2017 1454   APPEARANCEUR CLOUDY (A) 11/05/2017 1454   LABSPEC 1.023 11/05/2017 1454   PHURINE 5.0 11/05/2017 1454   GLUCOSEU >=500 (A) 11/05/2017 1454   HGBUR NEGATIVE 11/05/2017 1454   BILIRUBINUR NEGATIVE 11/05/2017 1454   KETONESUR 5 (A) 11/05/2017 1454   PROTEINUR 30 (A) 11/05/2017 1454   NITRITE NEGATIVE 11/05/2017 1454   LEUKOCYTESUR LARGE (A) 11/05/2017 1454   Sepsis Labs Invalid input(s): PROCALCITONIN,  WBC,  LACTICIDVEN Microbiology Recent Results (from the past 240 hour(s))  Urine culture     Status: Abnormal   Collection Time: 11/05/17  2:54 PM  Result Value Ref Range Status   Specimen Description URINE, RANDOM  Final   Special Requests NONE  Final   Culture MULTIPLE SPECIES PRESENT, SUGGEST RECOLLECTION (A)  Final   Report Status 11/06/2017 FINAL  Final  Culture, blood (routine x 2) Call MD if unable to obtain prior to antibiotics being given     Status: None (Preliminary result)   Collection Time: 11/05/17  6:45 PM  Result Value Ref Range Status   Specimen Description BLOOD RIGHT ANTECUBITAL  Final   Special Requests   Final  BOTTLES DRAWN AEROBIC AND ANAEROBIC Blood Culture adequate volume   Culture   Final    NO GROWTH < 12 HOURS Performed at [facility], [address], [place], [place] [address]    Report Status PENDING  Incomplete  Culture, blood (routine x 2) Call MD if unable to obtain prior to antibiotics being given      Status: None (Preliminary result)   Collection Time: 11/05/17  8:08 PM  Result Value Ref Range Status   Specimen Description BLOOD RIGHT ANTECUBITAL  Final   Special Requests   Final    BOTTLES DRAWN AEROBIC AND ANAEROBIC Blood Culture adequate volume   Culture   Final    NO GROWTH < 12 HOURS Performed at [facility], [address] [place], [place] [address]    Report Status PENDING  Incomplete  C difficile quick scan w PCR reflex     Status: None   Collection Time: 11/06/17  6:45 PM  Result Value Ref Range Status   C Diff antigen NEGATIVE NEGATIVE Final   C Diff toxin NEGATIVE NEGATIVE Final   C Diff interpretation No C. difficile detected.  Final     Time coordinating discharge: Over 30 minutes  SIGNED:   Meredith Leeds, MD  [facility] 11/07/2017, 11:12 AM Pager [phone]  If 7PM-7AM, please contact night-coverage [email or web address] Password [email or web address]

## 2017-11-10 LAB — CULTURE, BLOOD (ROUTINE X 2)
Culture: NO GROWTH
Culture: NO GROWTH
SPECIAL REQUESTS: ADEQUATE
Special Requests: ADEQUATE

## 2017-12-09 ENCOUNTER — Ambulatory Visit (INDEPENDENT_AMBULATORY_CARE_PROVIDER_SITE_OTHER): Payer: Medicare Other

## 2017-12-09 ENCOUNTER — Encounter (HOSPITAL_COMMUNITY): Payer: Self-pay | Admitting: Emergency Medicine

## 2017-12-09 ENCOUNTER — Ambulatory Visit (HOSPITAL_COMMUNITY)
Admission: EM | Admit: 2017-12-09 | Discharge: 2017-12-09 | Disposition: A | Discharge status: Home / Self Care | Payer: Medicare Other | Attending: Family Medicine | Admitting: Family Medicine

## 2017-12-09 DIAGNOSIS — E1165 Type 2 diabetes mellitus with hyperglycemia: Secondary | ICD-10-CM | POA: Diagnosis not present

## 2017-12-09 DIAGNOSIS — F329 Major depressive disorder, single episode, unspecified: Secondary | ICD-10-CM | POA: Insufficient documentation

## 2017-12-09 DIAGNOSIS — R05 Cough: Secondary | ICD-10-CM | POA: Diagnosis present

## 2017-12-09 DIAGNOSIS — Z87891 Personal history of nicotine dependence: Secondary | ICD-10-CM | POA: Diagnosis not present

## 2017-12-09 DIAGNOSIS — E119 Type 2 diabetes mellitus without complications: Secondary | ICD-10-CM | POA: Insufficient documentation

## 2017-12-09 DIAGNOSIS — Z79899 Other long term (current) drug therapy: Secondary | ICD-10-CM | POA: Insufficient documentation

## 2017-12-09 DIAGNOSIS — Z881 Allergy status to other antibiotic agents status: Secondary | ICD-10-CM | POA: Diagnosis not present

## 2017-12-09 DIAGNOSIS — E785 Hyperlipidemia, unspecified: Secondary | ICD-10-CM | POA: Diagnosis not present

## 2017-12-09 DIAGNOSIS — Z79890 Hormone replacement therapy (postmenopausal): Secondary | ICD-10-CM | POA: Insufficient documentation

## 2017-12-09 DIAGNOSIS — Z794 Long term (current) use of insulin: Secondary | ICD-10-CM | POA: Insufficient documentation

## 2017-12-09 DIAGNOSIS — R5383 Other fatigue: Secondary | ICD-10-CM | POA: Diagnosis not present

## 2017-12-09 DIAGNOSIS — I10 Essential (primary) hypertension: Secondary | ICD-10-CM | POA: Diagnosis not present

## 2017-12-09 DIAGNOSIS — I480 Paroxysmal atrial fibrillation: Secondary | ICD-10-CM | POA: Insufficient documentation

## 2017-12-09 DIAGNOSIS — Z882 Allergy status to sulfonamides status: Secondary | ICD-10-CM | POA: Diagnosis not present

## 2017-12-09 DIAGNOSIS — Z885 Allergy status to narcotic agent status: Secondary | ICD-10-CM | POA: Insufficient documentation

## 2017-12-09 DIAGNOSIS — F0390 Unspecified dementia without behavioral disturbance: Secondary | ICD-10-CM | POA: Diagnosis not present

## 2017-12-09 DIAGNOSIS — E039 Hypothyroidism, unspecified: Secondary | ICD-10-CM | POA: Insufficient documentation

## 2017-12-09 DIAGNOSIS — R0902 Hypoxemia: Secondary | ICD-10-CM | POA: Diagnosis not present

## 2017-12-09 LAB — POCT URINALYSIS DIP (DEVICE)
BILIRUBIN URINE: NEGATIVE
GLUCOSE, UA: 500 mg/dL — AB
Hgb urine dipstick: NEGATIVE
Ketones, ur: NEGATIVE mg/dL
Nitrite: NEGATIVE
Protein, ur: NEGATIVE mg/dL
Specific Gravity, Urine: 1.015 (ref 1.005–1.030)
UROBILINOGEN UA: 0.2 mg/dL (ref 0.0–1.0)
pH: 6 (ref 5.0–8.0)

## 2017-12-09 LAB — GLUCOSE, CAPILLARY: Glucose-Capillary: 353 mg/dL — ABNORMAL HIGH (ref 65–99)

## 2017-12-09 NOTE — Discharge Instructions (Signed)
Chest Xray did not show pneumonia.   We are sending urine for culture.   Please follow up with PCP this week to discuss Diabetes.

## 2017-12-09 NOTE — ED Triage Notes (Signed)
PT was hospitalized for pneumonia in January. PT had home health nurse come today. She reported hearing lung changes and PT reports increased fatigue.

## 2017-12-09 NOTE — ED Provider Notes (Signed)
[facility]    CSN: [id number] Arrival date & time: 12/09/17  1309     History   Chief Complaint Chief Complaint  Patient presents with  . Cough    HPI Roberta Anderson is a 84 y.o. female history of type 2 diabetes, hypertension, history of dementia, paroxysmal A. fib, presenting today with increased fatigue.  She was sent here after home health nurse listen to her lungs and her changes in her breathing at bilateral bases.  She was recently admitted at the end of January for pneumonia and UTI.  She was discharged on January 25.  Since she has had continued fatigue and weakness and has not felt herself.  She denies any coughing, but does feel short of breath at times.  She denies any urinary symptoms at this time, denies dysuria, increased frequency, urgency.  HPI  Past Medical History:  Diagnosis Date  . Dementia 11/05/2017    Patient Active Problem List   Diagnosis Date Noted  . Acute lower UTI 11/05/2017  . CAP (community acquired pneumonia) 11/05/2017  . HTN (hypertension) 11/05/2017  . HLD (hyperlipidemia) 11/05/2017  . Hypothyroidism 11/05/2017  . Type 2 diabetes mellitus without complication (HCC) 11/05/2017  . Depression 11/05/2017  . Generalized weakness 11/05/2017  . Hypoxia 11/05/2017    History reviewed. No pertinent surgical history.  OB History    No data available       Home Medications    Prior to Admission medications   Medication Sig Start Date End Date Taking? Authorizing Provider  Ascorbic Acid (VITAMIN C) 1000 MG tablet Take 1,000 mg by mouth daily.    [provider]  B Complex Vitamins (B COMPLEX PO) Take 1 tablet by mouth daily.    [provider]  Cholecalciferol (VITAMIN D PO) Take 1 tablet by mouth daily.    [provider]  CINNAMON PO Take 1,000 mg by mouth daily.    [provider]  Coenzyme Q10 (CO Q 10 PO) Take 1 tablet by mouth daily.    [provider]  CRANBERRY PO Take 1  tablet by mouth daily.    [provider]  dextromethorphan-guaiFENesin (MUCINEX DM) 30-600 MG 12hr tablet Take 1 tablet by mouth 2 (two) times daily. 11/07/17   Meredith Leeds, MD  digoxin (LANOXIN) 0.125 MG tablet Take 125 mcg by mouth daily. 10/15/17   [provider]  diltiazem (CARDIZEM CD) 120 MG 24 hr capsule Take 120 mg by mouth daily. For blood pressure 09/17/17   [provider]  Docusate Calcium (STOOL SOFTENER PO) Take 1 tablet by mouth daily.    [provider]  donepezil (ARICEPT) 10 MG tablet Take 10 mg by mouth every evening. 09/17/17   [provider]  doxycycline (VIBRA-TABS) 100 MG tablet Take 1 tablet (100 mg total) by mouth 2 (two) times daily. 11/07/17   Meredith Leeds, MD  DULoxetine (CYMBALTA) 30 MG capsule Take 30 mg by mouth daily. 10/15/17   [provider]  HUMALOG KWIKPEN 100 UNIT/ML KiwkPen Inject 2 Units into the skin 3 (three) times daily. BEFORE [facility] MEAL 08/20/17   [provider]  hydrOXYzine (ATARAX/VISTARIL) 25 MG tablet Take 25 mg by mouth 3 (three) times daily as needed for anxiety. 09/17/17   [provider]  LEVEMIR FLEXTOUCH 100 UNIT/ML Pen Inject 42 Units into the skin every evening. ADJUST AS NEEDED 08/20/17   [provider]  levothyroxine (SYNTHROID, LEVOTHROID) 112 MCG tablet Take 112 mcg by  mouth daily. 09/16/17   [provider]  meclizine (ANTIVERT) 25 MG tablet Take 25 mg by mouth 4 (four) times daily as needed for dizziness. 09/17/17   [provider]  oseltamivir (TAMIFLU) 75 MG capsule Take 1 capsule (75 mg total) by mouth 2 (two) times daily. 11/07/17   Meredith Leeds, MD  pantoprazole (PROTONIX) 40 MG tablet Take 40 mg by mouth 2 (two) times daily. 09/17/17   [provider]  simvastatin (ZOCOR) 10 MG tablet Take 10 mg by mouth every evening. 09/17/17   [provider]    Family History No family history on file.  Social  History Social History   Tobacco Use  . Smoking status: Former Smoker    Last attempt to quit: 1995    Years since quitting: 24.1  . Smokeless tobacco: Never Used  Substance Use Topics  . Alcohol use: No    Frequency: Never  . Drug use: No     Allergies   Hydrocodone-acetaminophen; Levofloxacin; Cephalexin; Ciprofloxacin; Codeine; and Sulfamethoxazole-trimethoprim   Review of Systems Review of Systems  Constitutional: Positive for fatigue. Negative for activity change, appetite change and fever.  HENT: Positive for congestion and rhinorrhea. Negative for sore throat.   Respiratory: Positive for shortness of breath. Negative for cough.   Cardiovascular: Negative for chest pain.  Gastrointestinal: Negative for abdominal pain, diarrhea, nausea and vomiting.  Genitourinary: Negative for dysuria, frequency and urgency.  Skin: Negative for rash.  Neurological: Positive for weakness. Negative for dizziness, light-headedness and headaches.     Physical Exam Triage Vital Signs ED Triage Vitals  Enc Vitals Group     BP 12/09/17 1409 (!) 169/82     Pulse Rate 12/09/17 1409 63     Resp 12/09/17 1409 20     Temp 12/09/17 1409 98.1 F (36.7 C)     Temp Source 12/09/17 1409 Oral     SpO2 12/09/17 1409 94 %     Weight 12/09/17 1407 200 lb (90.7 kg)     Height --      Head Circumference --      Peak Flow --      Pain Score 12/09/17 1407 0     Pain Loc --      Pain Edu? --      Excl. in GC? --    No data found.  Updated Vital Signs BP (!) 169/82   Pulse 63   Temp 98.1 F (36.7 C) (Oral)   Resp 20   Wt 200 lb (90.7 kg)   SpO2 94%   BMI 35.43 kg/m   Visual Acuity Right Eye Distance:   Left Eye Distance:   Bilateral Distance:    Right Eye Near:   Left Eye Near:    Bilateral Near:     Physical Exam  Constitutional: She is oriented to person, place, and time. She appears well-developed and well-nourished. No distress.  HENT:  Head: Normocephalic and atraumatic.   Mouth/Throat: Oropharynx is clear and moist. No oropharyngeal exudate.  Eyes: Conjunctivae are normal.  Neck: Neck supple.  Cardiovascular: Normal rate and regular rhythm.  No murmur heard. Pulmonary/Chest: Effort normal and breath sounds normal. No respiratory distress.  Breathing comfortably at rest, Breath sounds mildly more coarse at bilateral bases  Abdominal: Soft. There is no tenderness.  Musculoskeletal: She exhibits no edema.  Neurological: She is alert and oriented to person, place, and time.  Skin: Skin is warm and dry.  Psychiatric: She has a normal mood  and affect.  Nursing note and vitals reviewed.       UC Treatments / Results  Labs (all labs ordered are listed, but only abnormal results are displayed) Labs Reviewed  GLUCOSE, CAPILLARY - Abnormal; Notable for the following components:      Result Value   Glucose-Capillary 353 (*)    All other components within normal limits  POCT URINALYSIS DIP (DEVICE) - Abnormal; Notable for the following components:   Glucose, UA 500 (*)    Leukocytes, UA TRACE (*)    All other components within normal limits  URINE CULTURE    EKG  EKG Interpretation None       Radiology Dg Chest 2 View  Result Date: 12/09/2017 CLINICAL DATA:  Cough and chest congestion, hypoxia, recent episode of pneumonia. Duration of current symptoms 1 month. History of asthma, former smoker. EXAM: CHEST  2 VIEW COMPARISON:  Chest x-ray of November 05, 2017 FINDINGS: The lungs are well-expanded. The interstitial markings are coarse but this is not new. There is chronically increased calcific density in the region of the mitral valvular annulus. There is an ascending aortic stent graft in place. The heart is normal in size. There is calcification in the wall of the thoracic aorta. There is no pleural effusion. There is multilevel degenerative disc disease of the thoracic spine. IMPRESSION: Chronic bronchitic-reactive airway changes. No acute pneumonia.  No CHF. Thoracic aortic atherosclerosis. Electronically Signed   By: David  [place]az[place] M.D.   On: 12/09/2017 14:41    Procedures Procedures (including critical care time)  Medications Ordered in UC Medications - No data to display   Initial Impression / Assessment and Plan / UC Course  I have reviewed the triage vital signs and the nursing notes.  Pertinent labs & imaging results that were available during my care of the patient were reviewed by me and considered in my medical decision making (see chart for details).     No new or acute finding on CXR. Urine with trace leuks will send for culture. Patient with uncontrolled DM. Does not have PCP in area, [place] in [place], has difficulty returning back to PCP. Discussed getting set up with a new PCP here for better control. Blood glucose 353, >500 glucose in urine, but does not appear vomiting or lethargic. DM seems uncontrolled vs HHS.   Discussed strict return precautions. Patient verbalized understanding and is agreeable with plan.   Final Clinical Impressions(s) / UC Diagnoses   Final diagnoses:  Fatigue, unspecified type    ED Discharge Orders    None       Controlled Substance Prescriptions [place] Controlled Substance Registry consulted? Not Applicable   Lew DawesWieters, Hallie C, [place]-C 12/09/17 2316

## 2017-12-10 LAB — URINE CULTURE

## 2017-12-12 ENCOUNTER — Telehealth: Payer: Self-pay | Admitting: Internal Medicine

## 2017-12-12 NOTE — Telephone Encounter (Signed)
Called patient's sister and let her know. She is going to look into some of the other offices.

## 2017-12-12 NOTE — Telephone Encounter (Signed)
I am not currently accepting new patients.  °

## 2017-12-12 NOTE — Telephone Encounter (Signed)
Copied from CRM [phone]. Topic: General - Other >> Dec 12, 2017 11:24 AM Gerrianne ScalePayne, Angela L wrote: Reason for CRM: patient sister Haskel Schroederauline Wunschel Dr Plotnikovs patient states that she had spoken with her provider about her sister needing a new provider and Dr Posey ReaPlotnikov handed her Dr Lawerance BachBurns card so the patient would like a become Dr Lawerance BachBurns New Patient please call her sister Haskel Schroederauline  Mattes back to let her know if Dr Lawerance BachBurns would accept her her number is [phone]

## 2017-12-12 NOTE — Telephone Encounter (Signed)
Would you be able to see this patient to establish care?

## 2017-12-17 ENCOUNTER — Ambulatory Visit: Payer: Medicare Other | Admitting: Family Medicine

## 2017-12-26 ENCOUNTER — Ambulatory Visit (INDEPENDENT_AMBULATORY_CARE_PROVIDER_SITE_OTHER): Payer: Medicare Other | Admitting: Family Medicine

## 2017-12-26 ENCOUNTER — Encounter: Payer: Self-pay | Admitting: Family Medicine

## 2017-12-26 VITALS — BP 138/70 | HR 70 | Temp 98.0°F | Ht 63.0 in | Wt 202.0 lb

## 2017-12-26 DIAGNOSIS — K219 Gastro-esophageal reflux disease without esophagitis: Secondary | ICD-10-CM

## 2017-12-26 DIAGNOSIS — Z794 Long term (current) use of insulin: Secondary | ICD-10-CM | POA: Diagnosis not present

## 2017-12-26 DIAGNOSIS — I10 Essential (primary) hypertension: Secondary | ICD-10-CM

## 2017-12-26 DIAGNOSIS — E119 Type 2 diabetes mellitus without complications: Secondary | ICD-10-CM | POA: Diagnosis not present

## 2017-12-26 DIAGNOSIS — E039 Hypothyroidism, unspecified: Secondary | ICD-10-CM | POA: Diagnosis not present

## 2017-12-26 DIAGNOSIS — Z7689 Persons encountering health services in other specified circumstances: Secondary | ICD-10-CM

## 2017-12-26 NOTE — Progress Notes (Signed)
Patient presents to clinic today to establish care.  Patient is accompanied by her twin sister, Roberta Anderson.  SUBJECTIVE: PMH: Pt is an 84 yo with pmh sig for Asthma, arthritis, depression, DM II, GERD, HTN, HLD, hypothyroidism, history of stroke.  Patient was previously seen by Dr. Marcie Bal, [phone].  Last OFV was November 2018.  DM, type II: -Patient checks her blood sugars occasionally at home.  States FS BS this a.m. was 221. -Patient states she takes 2 units of Humalog with meals 3 times daily and 42 units of Levemir at night. -In the past patient was on byetta. -Patient denies increased urination, increased thirst, increased hunger.  HTN: -Patient takes diltiazem 120 mg daily -does not check bp at home. -of not has rx for digozxin 0.125 mg.  Does not recall h/o afib.  GERD: -Taking Protonix 40 mg daily  Seasonal allergies: -Increased sneezing -May take Claritin or Benadryl for symptoms.  Hypothyroidism: -Currently taking levothyroxine 112 mcg daily -Unclear when last TSH was checked  History of asthma: -Patient denies recent symptoms -Not requiring an inhaler  HLD: -Taking Zocor 10 mg -Denies myalgia  Memory loss: -pt taking Donepezil 10 mg q evening -no formal dx of dementia -pt's sister endorses being asked the same thing over and over.  Allergies: Hydrocodone-acetaminophen caused shortness of breath Levofloxacin causes shortness of breath cephalexin-rash Ciprofloxacin-rash Codeine-rash Sulfamethoxazole-trimethoprim causes rash  Past surgical history: Cholecystectomy 1975 Appendectomy 1965 Tonsillectomy Hysterectomy 2/2 AUB 1979  Social history: Patient is widowed.  She is currently living with her twin sister.  Patient is a retired Art gallery manager.  Patient has 2 children.  Patient denies alcohol, tobacco, drug use.  Patient is a former smoker, she quit over 10 years ago.  She was smoking 1 pack/day at that time.  Health  Maintenance: Dental --Dr. Tera Mater Sr. In [place] --2014 influenza, TB skin Colonoscopy --2005 Mammogram --3 years ago PAP --unsure Bone Density--unsure   Past Medical History:  Diagnosis Date  . Dementia 11/05/2017    History reviewed. No pertinent surgical history.  Current Outpatient Medications on File Prior to Visit  Medication Sig Dispense Refill  . Ascorbic Acid (VITAMIN C) 1000 MG tablet Take 1,000 mg by mouth daily.    Marland Kitchen aspirin EC 81 MG tablet Take 81 mg by mouth daily.    . B Complex Vitamins (B COMPLEX PO) Take 1 tablet by mouth daily.    . Cholecalciferol (VITAMIN D PO) Take 1 tablet by mouth daily.    Marland Kitchen CINNAMON PO Take 1,000 mg by mouth daily.    . Coenzyme Q10 (CO Q 10 PO) Take 1 tablet by mouth daily.    Marland Kitchen CRANBERRY PO Take 1 tablet by mouth daily.    Marland Kitchen dextromethorphan-guaiFENesin (MUCINEX DM) 30-600 MG 12hr tablet Take 1 tablet by mouth 2 (two) times daily. 10 tablet 0  . digoxin (LANOXIN) 0.125 MG tablet Take 125 mcg by mouth daily.  11  . diltiazem (CARDIZEM CD) 120 MG 24 hr capsule Take 120 mg by mouth daily. For blood pressure  11  . Docusate Calcium (STOOL SOFTENER PO) Take 1 tablet by mouth daily.    Marland Kitchen donepezil (ARICEPT) 10 MG tablet Take 10 mg by mouth every evening.  11  . DULoxetine (CYMBALTA) 30 MG capsule Take 30 mg by mouth daily.  11  . HUMALOG KWIKPEN 100 UNIT/ML KiwkPen Inject 2 Units into the skin 3 (three) times daily. BEFORE EACH MEAL  11  . hydrOXYzine (ATARAX/VISTARIL) 25 MG  tablet Take 25 mg by mouth 3 (three) times daily as needed for anxiety.  11  . LEVEMIR FLEXTOUCH 100 UNIT/ML Pen Inject 42 Units into the skin every evening. ADJUST AS NEEDED  11  . levothyroxine (SYNTHROID, LEVOTHROID) 112 MCG tablet Take 112 mcg by mouth daily.  11  . meclizine (ANTIVERT) 25 MG tablet Take 25 mg by mouth 4 (four) times daily as needed for dizziness.  2  . pantoprazole (PROTONIX) 40 MG tablet Take 40 mg by mouth 2 (two) times daily.  11  .  simvastatin (ZOCOR) 10 MG tablet Take 10 mg by mouth every evening.  11   No current facility-administered medications on file prior to visit.     Allergies  Allergen Reactions  . Hydrocodone-Acetaminophen Shortness Of Breath  . Levofloxacin Shortness Of Breath  . Cephalexin Rash  . Ciprofloxacin Rash  . Codeine Rash  . Sulfamethoxazole-Trimethoprim Rash    History reviewed. No pertinent family history.  Social History   Socioeconomic History  . Marital status: Widowed    Spouse name: Not on file  . Number of children: Not on file  . Years of education: Not on file  . Highest education level: Not on file  Social Needs  . Financial resource strain: Not on file  . Food insecurity - worry: Not on file  . Food insecurity - inability: Not on file  . Transportation needs - medical: Not on file  . Transportation needs - non-medical: Not on file  Occupational History  . Not on file  Tobacco Use  . Smoking status: Former Smoker    Last attempt to quit: 1995    Years since quitting: 24.2  . Smokeless tobacco: Never Used  Substance and Sexual Activity  . Alcohol use: No    Frequency: Never  . Drug use: No  . Sexual activity: Not on file  Other Topics Concern  . Not on file  Social History Narrative  . Not on file    ROS General: Denies fever, chills, night sweats, changes in weight, changes in appetite +memory concern. HEENT: Denies headaches, ear pain, changes in vision, rhinorrhea, sore throat CV: Denies CP, palpitations, SOB, orthopnea Pulm: Denies SOB, cough, wheezing GI: Denies abdominal pain, nausea, vomiting, diarrhea, constipation   +reflux GU: Denies dysuria, hematuria, frequency, vaginal discharge Msk: Denies muscle cramps, joint pains Neuro: Denies weakness, numbness, tingling Skin: Denies rashes, bruising Psych: Denies depression, anxiety, hallucinations  BP 138/70 (BP Location: Left Arm, Patient Position: Sitting, Cuff Size: Large)   Pulse 70   Temp 98  F (36.7 C) (Oral)   Ht _0  (1.6 m)   Wt 202 lb (91.6 kg)   SpO2 97%   BMI 35.78 kg/m   Physical Exam Gen. Pleasant, well developed, well-nourished, in NAD HEENT - [place]/AT, PERRL, no scleral icterus, no nasal drainage, partial upper denture in place. pharynx without erythema or exudate. Lungs: no use of accessory muscles, CTAB, no wheezes, rales or rhonchi Cardiovascular: RRR, No r/g/m, no peripheral edema Abdomen: BS present, soft, nontender,nondistended Musculoskeletal: No deformities, moves all four extremities, no cyanosis or clubbing, normal tone Neuro:  A&Ox3, CN II-XII intact, normal gait Skin:  Warm, dry, intact, no lesions Psych: normal affect, mood appropriate  Recent Results (from the past 2160 hour(s))  CBC with Differential/Platelet     Status: None   Collection Time: 11/05/17 10:43 AM  Result Value Ref Range   WBC 6.9 4.0 - 10.5 K/uL   RBC 4.63 3.87 - 5.11 MIL/uL  Hemoglobin 13.3 12.0 - 15.0 g/dL   HCT 39.5 36.0 - 46.0 %   MCV 85.3 78.0 - 100.0 fL   MCH 28.7 26.0 - 34.0 pg   MCHC 33.7 30.0 - 36.0 g/dL   RDW 14.9 11.5 - 15.5 %   Platelets 212 150 - 400 K/uL   Neutrophils Relative % 68 %   Neutro Abs 4.7 1.7 - 7.7 K/uL   Lymphocytes Relative 16 %   Lymphs Abs 1.1 0.7 - 4.0 K/uL   Monocytes Relative 15 %   Monocytes Absolute 1.0 0.1 - 1.0 K/uL   Eosinophils Relative 1 %   Eosinophils Absolute 0.1 0.0 - 0.7 K/uL   Basophils Relative 0 %   Basophils Absolute 0.0 0.0 - 0.1 K/uL  Comprehensive metabolic panel     Status: Abnormal   Collection Time: 11/05/17 10:43 AM  Result Value Ref Range   Sodium 133 (L) 135 - 145 mmol/L   Potassium 3.6 3.5 - 5.1 mmol/L   Chloride 99 (L) 101 - 111 mmol/L   CO2 24 22 - 32 mmol/L   Glucose, Bld 281 (H) 65 - 99 mg/dL   BUN 14 6 - 20 mg/dL   Creatinine, Ser 0.64 0.44 - 1.00 mg/dL   Calcium 9.4 8.9 - 10.3 mg/dL   Total Protein 7.3 6.5 - 8.1 g/dL   Albumin 3.8 3.5 - 5.0 g/dL   AST 30 15 - 41 U/L   ALT 25 14 - 54 U/L    Alkaline Phosphatase 94 38 - 126 U/L   Total Bilirubin 0.3 0.3 - 1.2 mg/dL   GFR calc non Af Amer >60 >60 mL/min   GFR calc Af Amer >60 >60 mL/min    Comment: (NOTE) The eGFR has been calculated using the CKD EPI equation. This calculation has not been validated in all clinical situations. eGFR's persistently <60 mL/min signify possible Chronic Kidney Disease.    Anion gap 10 5 - 15  Troponin I     Status: None   Collection Time: 11/05/17 10:43 AM  Result Value Ref Range   Troponin I <0.03 <0.03 ng/mL  Ethanol     Status: None   Collection Time: 11/05/17 10:43 AM  Result Value Ref Range   Alcohol, Ethyl (B) <10 <10 mg/dL    Comment:        LOWEST DETECTABLE LIMIT FOR SERUM ALCOHOL IS 10 mg/dL FOR MEDICAL PURPOSES ONLY   TSH     Status: None   Collection Time: 11/05/17 10:43 AM  Result Value Ref Range   TSH 0.965 0.350 - 4.500 uIU/mL    Comment: Performed by a 3rd Generation assay with a functional sensitivity of <=0.01 uIU/mL.  Rapid urine drug screen (hospital performed)     Status: None   Collection Time: 11/05/17  2:54 PM  Result Value Ref Range   Opiates NONE DETECTED NONE DETECTED   Cocaine NONE DETECTED NONE DETECTED   Benzodiazepines NONE DETECTED NONE DETECTED   Amphetamines NONE DETECTED NONE DETECTED   Tetrahydrocannabinol NONE DETECTED NONE DETECTED   Barbiturates NONE DETECTED NONE DETECTED    Comment: (NOTE) DRUG SCREEN FOR MEDICAL PURPOSES ONLY.  IF CONFIRMATION IS NEEDED FOR ANY PURPOSE, NOTIFY LAB WITHIN 5 DAYS. LOWEST DETECTABLE LIMITS FOR URINE DRUG SCREEN Drug Class                     Cutoff (ng/mL) Amphetamine and metabolites    1000 Barbiturate and metabolites    200 Benzodiazepine  947 Tricyclics and metabolites     300 Opiates and metabolites        300 Cocaine and metabolites        300 THC                            50   Urinalysis, Routine w reflex microscopic     Status: Abnormal   Collection Time: 11/05/17  2:54 PM    Result Value Ref Range   Color, Urine AMBER (A) YELLOW    Comment: BIOCHEMICALS MAY BE AFFECTED BY COLOR   APPearance CLOUDY (A) CLEAR   Specific Gravity, Urine 1.023 1.005 - 1.030   pH 5.0 5.0 - 8.0   Glucose, UA >=500 (A) NEGATIVE mg/dL   Hgb urine dipstick NEGATIVE NEGATIVE   Bilirubin Urine NEGATIVE NEGATIVE   Ketones, ur 5 (A) NEGATIVE mg/dL   Protein, ur 30 (A) NEGATIVE mg/dL   Nitrite NEGATIVE NEGATIVE   Leukocytes, UA LARGE (A) NEGATIVE   RBC / HPF TOO NUMEROUS TO COUNT 0 - 5 RBC/hpf   WBC, UA TOO NUMEROUS TO COUNT 0 - 5 WBC/hpf   Bacteria, UA FEW (A) NONE SEEN   Squamous Epithelial / LPF 6-30 (A) NONE SEEN   WBC Clumps PRESENT    Mucus PRESENT   Urine culture     Status: Abnormal   Collection Time: 11/05/17  2:54 PM  Result Value Ref Range   Specimen Description URINE, RANDOM    Special Requests NONE    Culture MULTIPLE SPECIES PRESENT, SUGGEST RECOLLECTION (A)    Report Status 11/06/2017 FINAL   Strep pneumoniae urinary antigen     Status: None   Collection Time: 11/05/17  2:54 PM  Result Value Ref Range   Strep Pneumo Urinary Antigen NEGATIVE NEGATIVE    Comment:        Infection due to S. pneumoniae cannot be absolutely ruled out since the antigen present may be below the detection limit of the test.   Legionella Pneumophila Serogp 1 Ur Ag     Status: None   Collection Time: 11/05/17  2:54 PM  Result Value Ref Range   L. pneumophila Serogp 1 Ur Ag Negative Negative    Comment: (NOTE) Presumptive negative for L. pneumophila serogroup 1 antigen in urine, suggesting no recent or current infection. Legionnaires' disease cannot be ruled out since other serogroups and species may also cause disease. Performed At: [facility] [place], [place] [id number] Rush Farmer MD [id number]    Source of Sample URINE, RANDOM   Influenza panel by PCR (type A & B)     Status: Abnormal   Collection Time: 11/05/17  4:55 PM  Result Value Ref  Range   Influenza A By PCR POSITIVE (A) NEGATIVE   Influenza B By PCR NEGATIVE NEGATIVE    Comment: (NOTE) The Xpert Xpress Flu assay is intended as an aid in the diagnosis of  influenza and should not be used as a sole basis for treatment.  This  assay is FDA approved for nasopharyngeal swab specimens only. Nasal  washings and aspirates are unacceptable for Xpert Xpress Flu testing.   Culture, blood (routine x 2) Call MD if unable to obtain prior to antibiotics being given     Status: None   Collection Time: 11/05/17  6:45 PM  Result Value Ref Range   Specimen Description BLOOD RIGHT ANTECUBITAL    Special Requests  BOTTLES DRAWN AEROBIC AND ANAEROBIC Blood Culture adequate volume   Culture      NO GROWTH 5 DAYS Performed at [facility], [place] [address], [place], [place] [address]    Report Status 11/10/2017 FINAL   HIV antibody     Status: None   Collection Time: 11/05/17  6:53 PM  Result Value Ref Range   HIV Screen 4th Generation wRfx Non Reactive Non Reactive    Comment: (NOTE) Performed At: [facility] [place], [place] [id number] Rush Farmer MD [id number]   CBC     Status: None   Collection Time: 11/05/17  6:53 PM  Result Value Ref Range   WBC 6.6 4.0 - 10.5 K/uL   RBC 4.60 3.87 - 5.11 MIL/uL   Hemoglobin 12.9 12.0 - 15.0 g/dL   HCT 39.4 36.0 - 46.0 %   MCV 85.7 78.0 - 100.0 fL   MCH 28.0 26.0 - 34.0 pg   MCHC 32.7 30.0 - 36.0 g/dL   RDW 15.2 11.5 - 15.5 %   Platelets 224 150 - 400 K/uL  Creatinine, serum     Status: None   Collection Time: 11/05/17  6:53 PM  Result Value Ref Range   Creatinine, Ser 0.69 0.44 - 1.00 mg/dL   GFR calc non Af Amer >60 >60 mL/min   GFR calc Af Amer >60 >60 mL/min    Comment: (NOTE) The eGFR has been calculated using the CKD EPI equation. This calculation has not been validated in all clinical situations. eGFR's persistently <60 mL/min signify possible Chronic Kidney Disease.   Hemoglobin  A1c     Status: Abnormal   Collection Time: 11/05/17  6:53 PM  Result Value Ref Range   Hgb A1c MFr Bld 11.8 (H) 4.8 - 5.6 %    Comment: (NOTE) Pre diabetes:          5.7%-6.4% Diabetes:              >6.4% Glycemic control for   <7.0% adults with diabetes    Mean Plasma Glucose 291.96 mg/dL    Comment: Performed at [facility] [address], [place], [place] [address]  Digoxin level     Status: Abnormal   Collection Time: 11/05/17  6:53 PM  Result Value Ref Range   Digoxin Level 0.3 (L) 0.8 - 2.0 ng/mL  Culture, blood (routine x 2) Call MD if unable to obtain prior to antibiotics being given     Status: None   Collection Time: 11/05/17  8:08 PM  Result Value Ref Range   Specimen Description BLOOD RIGHT ANTECUBITAL    Special Requests      BOTTLES DRAWN AEROBIC AND ANAEROBIC Blood Culture adequate volume   Culture      NO GROWTH 5 DAYS Performed at [facility] [address], [place], [place] [address]    Report Status 11/10/2017 FINAL   Glucose, capillary     Status: Abnormal   Collection Time: 11/05/17 10:38 PM  Result Value Ref Range   Glucose-Capillary 268 (H) 65 - 99 mg/dL  Basic metabolic panel     Status: Abnormal   Collection Time: 11/06/17  6:53 AM  Result Value Ref Range   Sodium 135 135 - 145 mmol/L   Potassium 4.1 3.5 - 5.1 mmol/L   Chloride 101 101 - 111 mmol/L   CO2 25 22 - 32 mmol/L   Glucose, Bld 265 (H) 65 - 99 mg/dL   BUN  14 6 - 20 mg/dL   Creatinine, Ser 0.70 0.44 - 1.00 mg/dL   Calcium 8.8 (L) 8.9 - 10.3 mg/dL   GFR calc non Af Amer >60 >60 mL/min   GFR calc Af Amer >60 >60 mL/min    Comment: (NOTE) The eGFR has been calculated using the CKD EPI equation. This calculation has not been validated in all clinical situations. eGFR's persistently <60 mL/min signify possible Chronic Kidney Disease.    Anion gap 9 5 - 15  CBC     Status: None   Collection Time: 11/06/17  6:53 AM  Result Value Ref Range   WBC 8.9 4.0 - 10.5 K/uL   RBC  4.46 3.87 - 5.11 MIL/uL   Hemoglobin 13.0 12.0 - 15.0 g/dL   HCT 38.4 36.0 - 46.0 %   MCV 86.1 78.0 - 100.0 fL   MCH 29.1 26.0 - 34.0 pg   MCHC 33.9 30.0 - 36.0 g/dL   RDW 15.2 11.5 - 15.5 %   Platelets 214 150 - 400 K/uL  Glucose, capillary     Status: Abnormal   Collection Time: 11/06/17  7:57 AM  Result Value Ref Range   Glucose-Capillary 242 (H) 65 - 99 mg/dL   Comment 1 Notify RN    Comment 2 Document in Chart   Glucose, capillary     Status: Abnormal   Collection Time: 11/06/17 11:57 AM  Result Value Ref Range   Glucose-Capillary 220 (H) 65 - 99 mg/dL   Comment 1 Notify RN    Comment 2 Document in Chart   Glucose, capillary     Status: Abnormal   Collection Time: 11/06/17  5:54 PM  Result Value Ref Range   Glucose-Capillary 133 (H) 65 - 99 mg/dL   Comment 1 Notify RN    Comment 2 Document in Chart   C difficile quick scan w PCR reflex     Status: None   Collection Time: 11/06/17  6:45 PM  Result Value Ref Range   C Diff antigen NEGATIVE NEGATIVE   C Diff toxin NEGATIVE NEGATIVE   C Diff interpretation No C. difficile detected.   Glucose, capillary     Status: Abnormal   Collection Time: 11/06/17 10:32 PM  Result Value Ref Range   Glucose-Capillary 195 (H) 65 - 99 mg/dL   Comment 1 Notify RN   Glucose, capillary     Status: Abnormal   Collection Time: 11/07/17  7:55 AM  Result Value Ref Range   Glucose-Capillary 295 (H) 65 - 99 mg/dL   Comment 1 Notify RN    Comment 2 Document in Chart   Glucose, capillary     Status: Abnormal   Collection Time: 11/07/17 12:10 PM  Result Value Ref Range   Glucose-Capillary 278 (H) 65 - 99 mg/dL   Comment 1 Notify RN    Comment 2 Document in Chart   POCT urinalysis dip (device)     Status: Abnormal   Collection Time: 12/09/17  2:42 PM  Result Value Ref Range   Glucose, UA 500 (A) NEGATIVE mg/dL   Bilirubin Urine NEGATIVE NEGATIVE   Ketones, ur NEGATIVE NEGATIVE mg/dL   Specific Gravity, Urine 1.015 1.005 - 1.030   Hgb urine  dipstick NEGATIVE NEGATIVE   pH 6.0 5.0 - 8.0   Protein, ur NEGATIVE NEGATIVE mg/dL   Urobilinogen, UA 0.2 0.0 - 1.0 mg/dL   Nitrite NEGATIVE NEGATIVE   Leukocytes, UA TRACE (A) NEGATIVE    Comment: Biochemical Testing Only. Please  order routine urinalysis from main lab if confirmatory testing is needed.  Glucose, capillary     Status: Abnormal   Collection Time: 12/09/17  2:46 PM  Result Value Ref Range   Glucose-Capillary 353 (H) 65 - 99 mg/dL  Urine culture     Status: Abnormal   Collection Time: 12/09/17  2:50 PM  Result Value Ref Range   Specimen Description URINE, CLEAN CATCH    Special Requests NONE    Culture (A)     80,000 COLONIES/mL GROUP B STREP(S.AGALACTIAE)ISOLATED TESTING AGAINST S. AGALACTIAE NOT ROUTINELY PERFORMED DUE TO PREDICTABILITY OF AMP/PEN/VAN SUSCEPTIBILITY. Performed at [facility], [place] [address] [place], [place] [address]    Report Status 12/10/2017 FINAL     Assessment/Plan: Type 2 diabetes mellitus without complication, with long-term current use of insulin (HCC) -continue humalog 2 units TID with meals and levemir 42 units at night. -pt encouraged to check fsbs and keep a log to bring with her to clinic so that proper adjustments can be made to insulin. -Will obtain records to see when last hgb A1C was. -f/u in 1-2 months  Essential hypertension -controlled -continue diltiazem 120 mg daily  Gastroesophageal reflux disease, esophagitis presence not specified -Continue Protonix 40 mg daily -Given a list of foods to avoid known to cause reflux symptoms  Acquired hypothyroidism -Continue levothyroxine 112 micrograms daily -We will see when last TSH was checked.  Encounter to establish care -We reviewed the PMH, PSH, FH, SH, Meds and Allergies. -We provided refills for any medications we will prescribe as needed.  Pt has refills for all meds until Nov. 2019. -We addressed current concerns per orders and patient instructions. -We have  asked for records for pertinent exams, studies, vaccines and notes from previous providers. -We have advised patient to follow up per instructions below.   F/u in 1-2 months  Grier Mitts, MD

## 2017-12-26 NOTE — Patient Instructions (Addendum)
Type 2 Diabetes Mellitus, Self Care, Adult Caring for yourself after you have been diagnosed with type 2 diabetes (type 2 diabetes mellitus) means keeping your blood sugar (glucose) under control with a balance of:  Nutrition.  Exercise.  Lifestyle changes.  Medicines or insulin, if necessary.  Support from your team of health care providers and others.  The following information explains what you need to know to manage your diabetes at home. What do I need to do to manage my blood glucose?  Check your blood glucose every day, as often as told by your health care provider.  Contact your health care provider if your blood glucose is above your target for 2 tests in a row.  Have your A1c (hemoglobin A1c) level checked at least two times a year, or as often as told by your health care provider. Your health care provider will set individualized treatment goals for you. Generally, the goal of treatment is to maintain the following blood glucose levels:  Before meals (preprandial): 80-130 mg/dL (4.4-7.2 mmol/L).  After meals (postprandial): below 180 mg/dL (10 mmol/L).  A1c level: less than 7%.  What do I need to know about hyperglycemia and hypoglycemia? What is hyperglycemia? Hyperglycemia, also called high blood glucose, occurs when blood glucose is too high.Make sure you know the early signs of hyperglycemia, such as:  Increased thirst.  Hunger.  Feeling very tired.  Needing to urinate more often than usual.  Blurry vision.  What is hypoglycemia? Hypoglycemia, also called low blood glucose, occurswith a blood glucose level at or below 70 mg/dL (3.9 mmol/L). The risk for hypoglycemia increases during or after exercise, during sleep, during illness, and when skipping meals or not eating for a long time (fasting). It is important to know the symptoms of hypoglycemia and treat it right away. Always have a 15-gram rapid-acting carbohydrate snack with you to treat low blood  glucose. Family members and close friends should also know the symptoms and should understand how to treat hypoglycemia, in case you are not able to treat yourself. What are the symptoms of hypoglycemia? Hypoglycemia symptoms can include:  Hunger.  Anxiety.  Sweating and feeling clammy.  Confusion.  Dizziness or feeling light-headed.  Sleepiness.  Nausea.  Increased heart rate.  Headache.  Blurry vision.  Seizure.  Nightmares.  Tingling or numbness around the mouth, lips, or tongue.  A change in speech.  Decreased ability to concentrate.  A change in coordination.  Restless sleep.  Tremors or shakes.  Fainting.  Irritability.  How do I treat hypoglycemia?  If you are alert and able to swallow safely, follow the 15:15 rule:  Take 15 grams of a rapid-acting carbohydrate. Rapid-acting options include: ? 1 tube of glucose gel. ? 3 glucose pills. ? 6-8 pieces of hard candy. ? 4 oz (120 mL) of fruit juice. ? 4 oz (120 mL) of regular (not diet) soda.  Check your blood glucose 15 minutes after you take the carbohydrate.  If the repeat blood glucose level is still at or below 70 mg/dL (3.9 mmol/L), take 15 grams of a carbohydrate again.  If your blood glucose level does not increase above 70 mg/dL (3.9 mmol/L) after 3 tries, seek emergency medical care.  After your blood glucose level returns to normal, eat a meal or a snack within 1 hour.  How do I treat severe hypoglycemia? Severe hypoglycemia is when your blood glucose level is at or below 54 mg/dL (3 mmol/L). Severe hypoglycemia is an emergency. Do not  wait to see if the symptoms will go away. Get medical help right away. Call your local emergency services (911 in the [place] Do not drive yourself to the hospital. If you have severe hypoglycemia and you cannot eat or drink, you may need an injection of glucagon. A family member or close friend should learn how to check your blood glucose and how to give you  a glucagon injection. Ask your health care provider if you need to have an emergency glucagon injection kit available. Severe hypoglycemia may need to be treated in a hospital. The treatment may include getting glucose through an IV tube. You may also need treatment for the cause of your hypoglycemia. Can having diabetes put me at risk for other conditions? Having diabetes can put you at risk for other long-term (chronic) conditions, such as heart disease and kidney disease. Your health care provider may prescribe medicines to help prevent complications from diabetes. These medicines may include:  Aspirin.  Medicine to lower cholesterol.  Medicine to control blood pressure.  What else can I do to manage my diabetes? Take your diabetes medicines as told  If your health care provider prescribed insulin or diabetes medicines, take them every day.  Do not run out of insulin or other diabetes medicines that you take. Plan ahead so you always have these available.  If you use insulin, adjust your dosage based on how physically active you are and what foods you eat. Your health care provider will tell you how to adjust your dosage. Make healthy food choices  The things that you eat and drink affect your blood glucose and your insulin dosage. Making good choices helps to control your diabetes and prevent other health problems. A healthy meal plan includes eating lean proteins, complex carbohydrates, fresh fruits and vegetables, low-fat dairy products, and healthy fats. Make an appointment to see a diet and nutrition specialist (registered dietitian) to help you create an eating plan that is right for you. Make sure that you:  Follow instructions from your health care provider about eating or drinking restrictions.  Drink enough fluid to keep your urine clear or pale yellow.  Eat healthy snacks between nutritious meals.  Track the carbohydrates that you eat. Do this by reading food labels and  learning the standard serving sizes of foods.  Follow your sick day plan whenever you cannot eat or drink as usual. Make this plan in advance with your health care provider.  Stay active  Exercise regularly, as told by your health care provider. This may include:  Stretching and doing strength exercises, such as yoga or weightlifting, at least 2 times a week.  Doing at least 150 minutes of moderate-intensity or vigorous-intensity exercise each week. This could be brisk walking, biking, or water aerobics. ? Spread out your activity over at least 3 days of the week. ? Do not go more than 2 days in a row without doing some kind of physical activity.  When you start a new exercise or activity, work with your health care provider to adjust your insulin, medicines, or food intake as needed. Make healthy lifestyle choices  Do not use any tobacco products, such as cigarettes, chewing tobacco, and e-cigarettes. If you need help quitting, ask your health care provider.  If your health care provider says that alcohol is safe for you, limit alcohol intake to no more than 1 drink per day for nonpregnant women and 2 drinks per day for men. One drink equals 12 oz of  beer, 5 oz of wine, or 1 oz of hard liquor.  Learn to manage stress. If you need help with this, ask your health care provider. Care for your body   Keep your immunizations up to date. In addition to getting vaccinations as told by your health care provider, it is recommended that you get vaccinated against the following illnesses: ? The flu (influenza). Get a flu shot every year. ? Pneumonia. ? Hepatitis B.  Schedule an eye exam soon after your diagnosis, and then one time every year after that.  Check your skin and feet every day for cuts, bruises, redness, blisters, or sores. Schedule a foot exam with your health care provider once every year.  Brush your teeth and gums two times a day, and floss at least one time a day. Visit your  dentist at least once every 6 months.  Maintain a healthy weight. General instructions  Take over-the-counter and prescription medicines only as told by your health care provider.  Share your diabetes management plan with people in your workplace, school, and household.  Check your urine for ketones when you are ill and as told by your health care provider.  Ask your health care provider: ? Do I need to meet with a diabetes educator? ? Where can I find a support group for people with diabetes?  Carry a medical alert card or wear medical alert jewelry.  Keep all follow-up visits as told by your health care provider. This is important. Where to find more information: For more information about diabetes, visit:  American Diabetes Association (ADA): www.diabetes.org  American Association of Diabetes Educators (AADE): www.diabeteseducator.org/patient-resources  This information is not intended to replace advice given to you by your health care provider. Make sure you discuss any questions you have with your health care provider. Document Released: 01/22/2016 Document Revised: 03/07/2016 Document Reviewed: 11/03/2015 Elsevier Interactive Patient Education  2018 [employer].  Dementia Dementia is the loss of two or more brain functions, such as:  Memory.  Decision making.  Behavior.  Speaking.  Thinking.  Problem solving.  There are many types of dementia. The most common type is called progressive dementia. Progressive dementia gets worse with time and it is irreversible. An example of this type of dementia is Alzheimer disease. What are the causes? This condition may be caused by:  Nerve cell damage in the brain.  Genetic mutations.  Certain medicines.  Multiple small strokes.  An infection, such as chronic meningitis.  A metabolic problem, such as vitamin B12 deficiency or thyroid disease.  Pressure on the brain, such as from a tumor or blood clot.  What  are the signs or symptoms? Symptoms of this condition include:  Sudden changes in mood.  Depression.  Problems with balance.  Changes in personality.  Poor short-term memory.  Agitation.  Delusions.  Hallucinations.  Having a hard time: ? Speaking thoughts. ? Finding words. ? Solving problems. ? Doing familiar tasks. ? Understanding familiar ideas.  How is this diagnosed? This condition is diagnosed with an assessment by your health care provider. During this assessment, your health care provider will talk with you and your family, friends, or caregivers about your symptoms. A thorough medical history will be taken, and you will have a physical exam and tests. Tests may include:  Lab tests, such as blood or urine tests.  Imaging tests, such as a CT scan, PET scan, or MRI.  A lumbar puncture. This test involves removing and testing a small amount  of the fluid that surrounds the brain and spinal cord.  An electroencephalogram (EEG). In this test, small metal discs are used to measure electrical activity in the brain.  Memory tests, cognitive tests, and neuropsychological tests. These tests evaluate brain function.  How is this treated? Treatment depends on the cause of the dementia. It may involve taking medicines that may help:  To control the dementia.  To slow down the disease.  To manage symptoms.  In some cases, treating the cause of the dementia can improve symptoms, reverse symptoms, or slow down how quickly the dementia gets worse. Your health care provider can help direct you to support groups, organizations, and other health care providers who can help with decisions about your care. Follow these instructions at home: Medicine  Take over-the-counter and prescription medicines only as told by your health care provider.  Avoid taking medicines that can affect thinking, such as pain or sleeping medicines. Lifestyle   Make healthy lifestyle  choices: ? Be physically active as told by your health care provider. ? Do not use any tobacco products, such as cigarettes, chewing tobacco, and e-cigarettes. If you need help quitting, ask your health care provider. ? Eat a healthy diet. ? Practice stress-management techniques when you get stressed. ? Stay social.  Drink enough fluid to keep your urine clear or pale yellow.  Make sure to get quality sleep. These tips can help you to get a good night's rest: ? Avoid napping during the day. ? Keep your sleeping area dark and cool. ? Avoid exercising during the few hours before you go to bed. ? Avoid caffeine products in the evening. General instructions  Work with your health care provider to determine what you need help with and what your safety needs are.  If you were given a bracelet that tracks your location, make sure to wear it.  Keep all follow-up visits as told by your health care provider. This is important. Contact a health care provider if:  You have any new symptoms.  You have problems with choking or swallowing.  You have any symptoms of a different illness. Get help right away if:  You develop a fever.  You have new or worsening confusion.  You have new or worsening sleepiness.  You have a hard time staying awake.  You or your family members become concerned for your safety. This information is not intended to replace advice given to you by your health care provider. Make sure you discuss any questions you have with your health care provider. Document Released: 03/26/2001 Document Revised: 02/08/2016 Document Reviewed: 06/28/2015 Elsevier Interactive Patient Education  2018 [place] for Gastroesophageal Reflux Disease, Adult When you have gastroesophageal reflux disease (GERD), the foods you eat and your eating habits are very important. Choosing the right foods can help ease the discomfort of GERD. Consider working with a diet and  nutrition specialist (dietitian) to help you make healthy food choices. What general guidelines should I follow? Eating plan  Choose healthy foods low in fat, such as fruits, vegetables, whole grains, low-fat dairy products, and lean meat, fish, and poultry.  Eat frequent, small meals instead of three large meals each day. Eat your meals slowly, in a relaxed setting. Avoid bending over or lying down until 2-3 hours after eating.  Limit high-fat foods such as fatty meats or fried foods.  Limit your intake of oils, butter, and shortening to less than 8 teaspoons each day.  Avoid the following: ?  Foods that cause symptoms. These may be different for different people. Keep a food diary to keep track of foods that cause symptoms. ? Alcohol. ? Drinking large amounts of liquid with meals. ? Eating meals during the 2-3 hours before bed.  Cook foods using methods other than frying. This may include baking, grilling, or broiling. Lifestyle   Maintain a healthy weight. Ask your health care provider what weight is healthy for you. If you need to lose weight, work with your health care provider to do so safely.  Exercise for at least 30 minutes on 5 or more days each week, or as told by your health care provider.  Avoid wearing clothes that fit tightly around your waist and chest.  Do not use any products that contain nicotine or tobacco, such as cigarettes and e-cigarettes. If you need help quitting, ask your health care provider.  Sleep with the head of your bed raised. Use a wedge under the mattress or blocks under the bed frame to raise the head of the bed. What foods are not recommended? The items listed may not be a complete list. Talk with your dietitian about what dietary choices are best for you. Grains Pastries or quick breads with added fat. [place] toast. Vegetables Deep fried vegetables. [place] fries. Any vegetables prepared with added fat. Any vegetables that cause symptoms. For  some people this may include tomatoes and tomato products, chili peppers, onions and garlic, and horseradish. Fruits Any fruits prepared with added fat. Any fruits that cause symptoms. For some people this may include citrus fruits, such as oranges, grapefruit, pineapple, and lemons. Meats and other protein foods High-fat meats, such as fatty beef or pork, hot dogs, ribs, ham, sausage, salami and bacon. Fried meat or protein, including fried fish and fried chicken. Nuts and nut butters. Dairy Whole milk and chocolate milk. Sour cream. Cream. Ice cream. Cream cheese. Milk shakes. Beverages Coffee and tea, with or without caffeine. Carbonated beverages. Sodas. Energy drinks. Fruit juice made with acidic fruits (such as orange or grapefruit). Tomato juice. Alcoholic drinks. Fats and oils Butter. Margarine. Shortening. Ghee. Sweets and desserts Chocolate and cocoa. Donuts. Seasoning and other foods Pepper. Peppermint and spearmint. Any condiments, herbs, or seasonings that cause symptoms. For some people, this may include curry, hot sauce, or vinegar-based salad dressings. Summary  When you have gastroesophageal reflux disease (GERD), food and lifestyle choices are very important to help ease the discomfort of GERD.  Eat frequent, small meals instead of three large meals each day. Eat your meals slowly, in a relaxed setting. Avoid bending over or lying down until 2-3 hours after eating.  Limit high-fat foods such as fatty meat or fried foods. This information is not intended to replace advice given to you by your health care provider. Make sure you discuss any questions you have with your health care provider. Document Released: 09/30/2005 Document Revised: 10/01/2016 Document Reviewed: 10/01/2016 Elsevier Interactive Patient Education  [employer].

## 2018-01-28 ENCOUNTER — Telehealth: Payer: Self-pay | Admitting: Family Medicine

## 2018-01-28 NOTE — Telephone Encounter (Signed)
Copied from CRM [id number]. Topic: Quick Communication - See Telephone Encounter >> Jan 28, 2018  2:25 PM Herby AbrahamJohnson, Shiquita C wrote: CRM for notification. See Telephone encounter for: 01/28/18.  April RN - [phone]   Pt is receiving home health services with [facility]. Nurse states that before and after pt taking medication pt blood sugar was 534 at today's visit. She suggest that pt is prescribed a oral medication for diabetes because pt doesn't take insulin like she is suppose to.    Please assist further

## 2018-01-28 NOTE — Telephone Encounter (Signed)
Please inquire if pt's sister is helping her with her insulin?  Pt needs to take her humalog now if her fsbs is still in the 500s.    Pt should take 5 units of humalog now.  Pt needs to be seen in clinic so medication adjustments can be made. If pt is feeling increased hunger, tired, frequent urination, dry or itchy skin, increased thirst, blurred vision she needs to be evaluated by the emergency department this evening.   Also it pt's bs was this elevated, this should have been brought to this provider's attention at that time, not hours later.

## 2018-01-28 NOTE — Telephone Encounter (Signed)
Please advise 

## 2018-01-29 NOTE — Telephone Encounter (Signed)
Spoke with pt's sister is was not aware of what the blood sugar reading were. Pt is currently sleeping and sister did not want to wake pt.  A follow-up appt was made for Monday 02/02/18. Pts sister was educated to monitor pt for feeling increased hunger, tired, frequent urination, dry or itchy skin, increased thirst, blurred vision she needs to be evaluated by the emergency department.

## 2018-02-02 ENCOUNTER — Ambulatory Visit (INDEPENDENT_AMBULATORY_CARE_PROVIDER_SITE_OTHER): Payer: Medicare Other | Admitting: Family Medicine

## 2018-02-02 ENCOUNTER — Encounter: Payer: Self-pay | Admitting: Family Medicine

## 2018-02-02 VITALS — BP 144/64 | HR 74 | Temp 97.9°F | Wt 199.5 lb

## 2018-02-02 DIAGNOSIS — E119 Type 2 diabetes mellitus without complications: Secondary | ICD-10-CM

## 2018-02-02 LAB — POCT GLYCOSYLATED HEMOGLOBIN (HGB A1C): Hemoglobin A1C: 111.9

## 2018-02-02 LAB — GLUCOSE, POCT (MANUAL RESULT ENTRY): POC Glucose: 414 mg/dl — AB (ref 70–99)

## 2018-02-02 NOTE — Patient Instructions (Addendum)
We are increasing your humalog to 5 units three times a day with meals.  We are leaving your levemir at 42 units at night.  You should f/u in clinic for a nurse's visit in 2 weeks.  Please bring your blood sugar log and or your blood glucose meter with you to this visit.  Hyperglycemia Hyperglycemia is when the sugar (glucose) level in your blood is too high. It may not cause symptoms. If you do have symptoms, they may include warning signs, such as:  Feeling more thirsty than normal.  Hunger.  Feeling tired.  Needing to pee (urinate) more than normal.  Blurry eyesight (vision).  You may get other symptoms as it gets worse, such as:  Dry mouth.  Not being hungry (loss of appetite).  Fruity-smelling breath.  Weakness.  Weight gain or loss that is not planned. Weight loss may be fast.  A tingling or numb feeling in your hands or feet.  Headache.  Skin that does not bounce back quickly when it is lightly pinched and released (poor skin turgor).  Pain in your belly (abdomen).  Cuts or bruises that heal slowly.  High blood sugar can happen to people who do or do not have diabetes. High blood sugar can happen slowly or quickly, and it can be an emergency. Follow these instructions at home: General instructions  Take over-the-counter and prescription medicines only as told by your doctor.  Do not use products that contain nicotine or tobacco, such as cigarettes and e-cigarettes. If you need help quitting, ask your doctor.  Limit alcohol intake to no more than 1 drink per day for nonpregnant women and 2 drinks per day for men. One drink equals 12 oz of beer, 5 oz of wine, or 1 oz of hard liquor.  Manage stress. If you need help with this, ask your doctor.  Keep all follow-up visits as told by your doctor. This is important. Eating and drinking  Stay at a healthy weight.  Exercise regularly, as told by your doctor.  Drink enough fluid, especially when  you: ? Exercise. ? Get sick. ? Are in hot temperatures.  Eat healthy foods, such as: ? Low-fat (lean) proteins. ? Complex carbs (complex carbohydrates), such as whole wheat bread or brown rice. ? Fresh fruits and vegetables. ? Low-fat dairy products. ? Healthy fats.  Drink enough fluid to keep your pee (urine) clear or pale yellow. If you have diabetes:  Make sure you know the symptoms of hyperglycemia.  Follow your diabetes management plan, as told by your doctor. Make sure you: ? Take insulin and medicines as told. ? Follow your exercise plan. ? Follow your meal plan. Eat on time. Do not skip meals. ? Check your blood sugar as often as told. Make sure to check before and after exercise. If you exercise longer or in a different way than you normally do, check your blood sugar more often. ? Follow your sick day plan whenever you cannot eat or drink normally. Make this plan ahead of time with your doctor.  Share your diabetes management plan with people in your workplace, school, and household.  Check your urine for ketones when you are ill and as told by your doctor.  Carry a card or wear jewelry that says that you have diabetes. Contact a doctor if:  Your blood sugar level is higher than 240 mg/dL (13.3 mmol/L) for 2 days in a row.  You have problems keeping your blood sugar in your target range.  High blood sugar happens often for you. Get help right away if:  You have trouble breathing.  You have a change in how you think, feel, or act (mental status).  You feel sick to your stomach (nauseous), and that feeling does not go away.  You cannot stop throwing up (vomiting). These symptoms may be an emergency. Do not wait to see if the symptoms will go away. Get medical help right away. Call your local emergency services (911 in the [place] Do not drive yourself to the hospital. Summary  Hyperglycemia is when the sugar (glucose) level in your blood is too high.  High  blood sugar can happen to people who do or do not have diabetes.  Make sure you drink enough fluids, eat healthy foods, and exercise regularly.  Contact your doctor if you have problems keeping your blood sugar in your target range. This information is not intended to replace advice given to you by your health care provider. Make sure you discuss any questions you have with your health care provider. Document Released: 07/28/2009 Document Revised: 06/17/2016 Document Reviewed: 06/17/2016 Elsevier Interactive Patient Education  2017 [place].  Type 2 Diabetes Mellitus, Self Care, Adult When you have type 2 diabetes (type 2 diabetes mellitus), you must keep your blood sugar (glucose) under control. You can do this with:  Nutrition.  Exercise.  Lifestyle changes.  Medicines or insulin, if needed.  Support from your doctors and others.  How do I manage my blood sugar?  Check your blood sugar level every day, as often as told.  Call your doctor if your blood sugar is above your goal numbers for 2 tests in a row.  Have your A1c (hemoglobin A1c) level checked at least two times a year. Have it checked more often if your doctor tells you to. Your doctor will set treatment goals for you. Generally, you should have these blood sugar levels:  Before meals (preprandial): 80-130 mg/dL (4.4-7.2 mmol/L).  After meals (postprandial): lower than 180 mg/dL (10 mmol/L).  A1c level: less than 7%.  What do I need to know about high blood sugar? High blood sugar is called hyperglycemia. Know the signs of high blood sugar. Signs may include:  Feeling: ? Thirsty. ? Hungry. ? Very tired.  Needing to pee (urinate) more than usual.  Blurry vision.  What do I need to know about low blood sugar? Low blood sugar is called hypoglycemia. This is when blood sugar is at or below 70 mg/dL (3.9 mmol/L). Symptoms may include:  Feeling: ? Hungry. ? Worried or nervous (anxious). ? Sweaty and  clammy. ? Confused. ? Dizzy. ? Sleepy. ? Sick to your stomach (nauseous).  Having: ? A fast heartbeat (palpitations). ? A headache. ? A change in your vision. ? Jerky movements that you cannot control (seizure). ? Nightmares. ? Tingling or no feeling (numbness) around the mouth, lips, or tongue.  Having trouble with: ? Talking. ? Paying attention (concentrating). ? Moving (coordination). ? Sleeping.  Shaking.  Passing out (fainting).  Getting upset easily (irritability).  Treating low blood sugar  To treat low blood sugar, eat or drink something sugary right away. If you can think clearly and swallow safely, follow the 15:15 rule:  Take 15 grams of a fast-acting carb (carbohydrate). Some fast-acting carbs are: ? 1 tube of glucose gel. ? 3 sugar tablets (glucose pills). ? 6-8 pieces of hard candy. ? 4 oz (120 mL) of fruit juice. ? 4 oz (120 mL) regular (not diet)  soda.  Check your blood sugar 15 minutes after you take the carb.  If your blood sugar is still at or below 70 mg/dL (3.9 mmol/L), take 15 grams of a carb again.  If your blood sugar does not go above 70 mg/dL (3.9 mmol/L) after 3 tries, get help right away.  After your blood sugar goes back to normal, eat a meal or a snack within 1 hour.  Treating very low blood sugar If your blood sugar is at or below 54 mg/dL (3 mmol/L), you have very low blood sugar (severe hypoglycemia). This is an emergency. Do not wait to see if the symptoms will go away. Get medical help right away. Call your local emergency services (911 in the [place] Do not drive yourself to the hospital. If you have very low blood sugar and you cannot eat or drink, you may need a glucagon shot (injection). A family member or friend should learn how to check your blood sugar and how to give you a glucagon shot. Ask your doctor if you need to have a glucagon shot kit at home. What else is important to manage my diabetes? Medicine Follow these  instructions about insulin and diabetes medicines:  Take them as told by your doctor.  Adjust them as told by your doctor.  Do not run out of them.  Having diabetes can raise your risk for other long-term conditions. These include heart or kidney disease. Your doctor may prescribe medicines to help prevent problems from diabetes. Food   Make healthy food choices. These include: ? Chicken, fish, egg whites, and beans. ? Oats, whole wheat, bulgur, brown rice, quinoa, and millet. ? Fresh fruits and vegetables. ? Low-fat dairy products. ? Nuts, avocado, olive oil, and canola oil.  Make a food plan with a specialist (dietitian).  Follow instructions from your doctor about what you cannot eat or drink.  Drink enough fluid to keep your pee (urine) clear or pale yellow.  Eat healthy snacks between healthy meals.  Keep track of carbs that you eat. Read food labels. Learn food serving sizes.  Follow your sick day plan when you cannot eat or drink normally. Make this plan with your doctor so it is ready to use. Activity  Exercise at least 3 times a week.  Do not go more than 2 days without exercising.  Talk with your doctor before you start a new exercise. Your doctor may need to adjust your insulin, medicines, or food. Lifestyle   Do not use any tobacco products. These include cigarettes, chewing tobacco, and e-cigarettes.If you need help quitting, ask your doctor.  Ask your doctor how much alcohol is safe for you.  Learn to deal with stress. If you need help with this, ask your doctor. Body care  Stay up to date with your shots (immunizations).  Have your eyes and feet checked by a doctor as often as told.  Check your skin and feet every day. Check for cuts, bruises, redness, blisters, or sores.  Brush your teeth and gums two times a day.  Floss at least one time a day.  Go to the dentist least one time every 6 months.  Stay at a healthy weight. General  instructions   Take over-the-counter and prescription medicines only as told by your doctor.  Share your diabetes care plan with: ? Your work or school. ? People you live with.  Check your pee (urine) for ketones: ? When you are sick. ? As told by your doctor.  Carry a card or wear jewelry that says that you have diabetes.  Ask your doctor: ? Do I need to meet with a diabetes educator? ? Where can I find a support group for people with diabetes?  Keep all follow-up visits as told by your doctor. This is important. Where to find more information: To learn more about diabetes, visit:  American Diabetes Association: www.diabetes.org  American Association of Diabetes Educators: www.diabeteseducator.org/patient-resources  This information is not intended to replace advice given to you by your health care provider. Make sure you discuss any questions you have with your health care provider. Document Released: 01/22/2016 Document Revised: 03/07/2016 Document Reviewed: 11/03/2015 Elsevier Interactive Patient Education  [employer].

## 2018-02-02 NOTE — Progress Notes (Signed)
Subjective:    Patient ID: Roberta Anderson, female    DOB: 01/06/1934, 84 y.o.   MRN: [id number]  Chief Complaint  Patient presents with  . Diabetes    HPI Patient was seen today for f/u on DM.  [facility] fsbs [facility] 500s.  Pt who lives with her sister endorses compliance with humalog 2 units TID and levemir 42 units qhs.  Pt does endorse missing a dose of meds.  Pt is unsure if she was ever on metformin or any other oral medication for diabetes.  Pt's sister is unsure as the pt just started living with her in October.  FSBS by memory 200s this am, and 300 after lunch.  Past Medical History:  Diagnosis Date  . Allergy   . Arthritis   . Asthma   . Dementia 11/05/2017  . Depression   . Diabetes mellitus without complication (HCC)   . History of stomach ulcers   . Hyperlipidemia   . Hypertension   . Rheumatic fever   . Stroke (HCC)   . Thyroid disease   . Urinary tract infection     Allergies  Allergen Reactions  . Hydrocodone-Acetaminophen Shortness Of Breath  . Levofloxacin Shortness Of Breath  . Cephalexin Rash  . Ciprofloxacin Rash  . Codeine Rash  . Sulfamethoxazole-Trimethoprim Rash    ROS General: Denies fever, chills, night sweats, changes in weight, changes in appetite  +tired, increased thirst HEENT: Denies headaches, ear pain, changes in vision, rhinorrhea, sore throat CV: Denies CP, palpitations, SOB, orthopnea Pulm: Denies SOB, cough, wheezing GI: Denies abdominal pain, nausea, vomiting, diarrhea, constipation GU: Denies dysuria, hematuria, frequency, vaginal discharge Msk: Denies muscle cramps, joint pains Neuro: Denies weakness, numbness, tingling Skin: Denies rashes, bruising Psych: Denies depression, anxiety, hallucinations     Objective:    Blood pressure (!) 144/64, pulse 74, temperature 97.9 F (36.6 C), temperature source Oral, weight 199 lb 8 oz (90.5 kg), SpO2 93 %.   Gen. Pleasant, well-nourished, in no  distress, normal affect   HEENT: /AT, face symmetric,no scleral icterus, PERRLA, nares patent without drainage Lungs: no accessory muscle use, CTAB, no wheezes or rales Cardiovascular: RRR, no m/r/g, no peripheral edema Abdomen: BS present, soft, NT/ND Neuro:  A&Ox3, CN II-XII intact, normal gait   Wt Readings from Last 3 Encounters:  02/02/18 199 lb 8 oz (90.5 kg)  12/26/17 202 lb (91.6 kg)  12/09/17 200 lb (90.7 kg)    Lab Results  Component Value Date   WBC 8.9 11/06/2017   HGB 13.0 11/06/2017   HCT 38.4 11/06/2017   PLT 214 11/06/2017   GLUCOSE 265 (H) 11/06/2017   ALT 25 11/05/2017   AST 30 11/05/2017   NA 135 11/06/2017   K 4.1 11/06/2017   CL 101 11/06/2017   CREATININE 0.70 11/06/2017   BUN 14 11/06/2017   CO2 25 11/06/2017   TSH 0.965 11/05/2017   HGBA1C 11.8 (H) 11/05/2017    Assessment/Plan:  Diabetes mellitus without complication (HCC) -fsbs in clinic 414 -pt given 4 units humalog as she did not take any insulin with lunch prior to appt. -Hgb A1C  11.8% -discussed importance of taking insulin regularly. -insulin adjusted.  humalog increased from 2 units to 5 units TID with meals.  -continue levemir 42 units qhs.  Will likely need to adjust this in the future. - Plan: POCT glucose (manual entry), POCT glycosylated hemoglobin (Hb A1C) -HH coming to pt twice/wk.  May benefit from  more frequent visits as pt has dementia and her twin sister is doing the best she can to help her sister. -Still awaiting records from previous PCP to see if pt had an intolerance to Metformin or other oral meds. -f/u in 2 wks, sooner if needed.  Abbe Amsterdam, MD

## 2018-02-17 ENCOUNTER — Ambulatory Visit (INDEPENDENT_AMBULATORY_CARE_PROVIDER_SITE_OTHER): Payer: Medicare Other | Admitting: *Deleted

## 2018-02-17 DIAGNOSIS — E119 Type 2 diabetes mellitus without complications: Secondary | ICD-10-CM | POA: Diagnosis not present

## 2018-02-17 LAB — GLUCOSE, POCT (MANUAL RESULT ENTRY): POC GLUCOSE: 410 mg/dL — AB (ref 70–99)

## 2018-02-17 NOTE — Progress Notes (Signed)
Patient here for POC glucose per last office visit note. CBG today 410. Patient has been checking home CBG BID w/ sugars ranging from 230s-430s. She is essentially asymptomatic and denies polyuria and polydypsia.  She is taking Levemir 42 units QHS and humalog 5 unites w/ meals as directed.  Discussed w/ Shirline Frees, NP in PCP's absence. Okay for patient to continue current meds and have Dr Salomon Fick advise on any changes when she returns to office this afternoon.  Pt notified of results/instructions and verbalized understanding.  Dr. Salomon Fick, please advise any med changes. Her home CBG log is in your red folder.

## 2018-02-24 ENCOUNTER — Other Ambulatory Visit: Payer: Self-pay | Admitting: Family Medicine

## 2018-02-24 DIAGNOSIS — E1165 Type 2 diabetes mellitus with hyperglycemia: Secondary | ICD-10-CM

## 2018-02-24 MED ORDER — METFORMIN HCL ER 500 MG PO TB24: 500.0000 mg | ORAL_TABLET | Freq: Every day | ORAL | 3 refills | Status: DC

## 2018-02-27 ENCOUNTER — Ambulatory Visit: Payer: Self-pay | Admitting: *Deleted

## 2018-02-27 NOTE — Telephone Encounter (Signed)
Spoke with patient and she stated that her BS was 424, gave instructions per Dr. [place] to take 5 U of Humalog and recheck BS in 3 to 4 hours. Patient verbalized understanding.

## 2018-02-27 NOTE — Telephone Encounter (Signed)
Message sent to Dr. Jordan for review. 

## 2018-02-27 NOTE — Telephone Encounter (Signed)
Can she check BS again, and wait for result while she is on the phone,so we can instruct about dose. Is she having any symptoms?  Thanks, BJ

## 2018-02-27 NOTE — Telephone Encounter (Signed)
BS now 420. 5 U of Humalog and recheck BS in 3-4 hours.  Thanks, BJ

## 2018-02-27 NOTE — Telephone Encounter (Signed)
  April, RN with [facility] called in with pt's glucose 507.   The pt missed her lunch time dose of insulin.   She went out and had lunch with her sister and had a carb heavy diet.   Only symptom she is having is feeling tired.  I routed a high priority note to Dr. Salomon Fick and contacted the flow coordinator for further instructions.  The nurse can be reached at [phone].  Name is April, RN.   Reason for Disposition . Blood glucose > 400 mg/dl (22 mmol/l)  Answer Assessment - Initial Assessment Questions 1. BLOOD GLUCOSE: "What is your blood glucose level?"      507  Forgot to take insulin before lunch.  Went out and had a carb heavy diet. 2. ONSET: "When did you check the blood glucose?"     Just a few minutes ago.   Nurse April, RN with [facility] is calling in. 3. USUAL RANGE: "What is your glucose level usually?" (e.g., usual fasting morning value, usual evening value)     Her normal range is 250-450.   They recently added metformin and increased her insulin. 4. KETONES: "Do you check for ketones (urine or blood test strips)?" If yes, ask: "What does the test show now?"      *No Answer* 5. TYPE 1 or 2:  "Do you know what type of diabetes you have?"  (e.g., Type 1, Type 2, Gestational; doesn't know)      Type 2 6. INSULIN: "Do you take insulin?" If yes, ask: "Have you missed any shots recently?"     Yes   It was increased to 5 units.   Takes a long acting insulin at bedtime.  7. DIABETES PILLS: "Do you take any pills for your diabetes?" If yes, ask: "Have you missed taking any pills recently?"     Missed lunch dose of insulin. 8. OTHER SYMPTOMS: "Do you have any symptoms?" (e.g., fever, frequent urination, difficulty breathing, dizziness, weakness, vomiting)     Tired.   She's used to it being high. 9. PREGNANCY: "Is there any chance you are pregnant?" "When was your last menstrual period?"     Not asked due to age  Protocols used: DIABETES - HIGH BLOOD  SUGAR-A-AH

## 2018-03-12 ENCOUNTER — Encounter: Payer: Self-pay | Admitting: Family Medicine

## 2018-03-30 ENCOUNTER — Telehealth: Payer: Self-pay | Admitting: Family Medicine

## 2018-03-30 ENCOUNTER — Ambulatory Visit: Payer: Self-pay | Admitting: *Deleted

## 2018-03-30 ENCOUNTER — Ambulatory Visit: Payer: Medicare Other | Admitting: Family Medicine

## 2018-03-30 NOTE — Telephone Encounter (Signed)
Spoke with pt. She took BS while on phone, it is now [address]20. Advised pt that this is extremely high and warrants immediate medical attention. She still refuses to be seen and evaluated. I asked if she had someone that could take her to an UC, she replied yes she did have someone at home that could take her. She states she will check it again in 30mins and call us back. I advised if was still elevated >500 she needs to go to UC ASAP. Pt voiced understanding.

## 2018-03-30 NOTE — Telephone Encounter (Signed)
See previous telephone encounter.

## 2018-03-30 NOTE — Telephone Encounter (Signed)
TC back to April, [facility] home health. 3:06pm cbg now  456 . Denies frequent voiding, SOB, dizziness, slurred speech. Patient is alert and oriented, per her sister. During our call, they(patient and sisters and family) report the patient ate a cupcake and lunch prior to her receiving 5 units humalog insulin. Family will bring her in for a 4:30p appointment today with Dr. Salomon FickBanks.

## 2018-03-30 NOTE — Telephone Encounter (Signed)
Patient's cbg after lunch, now is 573. She forgot to give herself 5 units humalog insulin so she gave it 20 minutes ago after eating. She took 42 units of Levemir last night as ordered. Doesn't feel well right now per April, [facility] home [facility] nurse. April stated the patient does not look any different than she usually does.  Please advise April, RN. She is sitting with patient at this time.

## 2018-03-30 NOTE — Telephone Encounter (Signed)
Dr. Salomon FickBanks - FYI, please advise if any recommendations. Thanks!

## 2018-03-30 NOTE — Telephone Encounter (Signed)
cbg 511 at 2:48p. April, Home health nurse sitting with patient .

## 2018-03-30 NOTE — Telephone Encounter (Signed)
Dr. Salomon FickBanks - Pt advised multiple times to seek care at UC/ED and has declined. Pt given appt for today and was not able to make it. FYI. Thanks!

## 2018-03-30 NOTE — Telephone Encounter (Signed)
  Reason for Disposition . Blood glucose > 400 mg/dl (22 mmol/l)  Protocols used: DIABETES - HIGH BLOOD SUGAR-A-AH

## 2018-03-30 NOTE — Telephone Encounter (Signed)
Per Dr. Salomon FickBanks she recommends pt go to ED for evaluation and treatment of elevated blood sugar.  Spoke with pt and she states that she feels fine. She does feel her BS is coming down. She does not wish to go to the ED at this time. Advised pt to recheck BS in 30 minutes. Pt asked me to call her back at that time. Advised pt if she becomes symptomatic of elevated BS she needs to be taken to ED immediately. Pt voiced understanding.

## 2018-03-30 NOTE — Telephone Encounter (Signed)
Call transferred to triage nurse by Roberta KaufmannMelissa, [facility] agent to direct pt to seek treatment at Urgent per Roberta Anderson, practice administrator who was contacted prior to transferring call. Pt had appt previously scheduled today at 4:30 but pt did not make it to the  appt. Pt's sister, Roberta Anderson stating that she was told an appt time and it was changed. Pt's BS noted to be 437 at this time. Advised pt's sister to take the pt to Urgent Care if BS stayed elevated. Pt's sister verbalized understanding. Offered to make pt appt to be seen tomorrow, but will wait to see if BS continues to decrease and will call the office back to schedule appt tomorrow if needed.

## 2018-04-21 ENCOUNTER — Encounter (HOSPITAL_COMMUNITY): Payer: Self-pay | Admitting: Emergency Medicine

## 2018-04-21 ENCOUNTER — Ambulatory Visit (INDEPENDENT_AMBULATORY_CARE_PROVIDER_SITE_OTHER): Payer: Medicare Other

## 2018-04-21 ENCOUNTER — Ambulatory Visit (HOSPITAL_COMMUNITY)
Admission: EM | Admit: 2018-04-21 | Discharge: 2018-04-21 | Disposition: A | Discharge status: Home / Self Care | Payer: Medicare Other | Attending: Family Medicine | Admitting: Family Medicine

## 2018-04-21 ENCOUNTER — Telehealth: Payer: Self-pay | Admitting: Family Medicine

## 2018-04-21 DIAGNOSIS — M659 Synovitis and tenosynovitis, unspecified: Secondary | ICD-10-CM

## 2018-04-21 MED ORDER — MELOXICAM 7.5 MG PO TABS: 7.5000 mg | ORAL_TABLET | Freq: Every day | ORAL | 0 refills | Status: AC

## 2018-04-21 NOTE — Discharge Instructions (Addendum)
It was nice meeting you!!  Your x-ray was negative.  I believe this is tendinitis in your wrist. We immobilized your wrist with a splint and prescribed some medicine for pain and inflammation. If this does not get better follow-up with your primary care doctor.

## 2018-04-21 NOTE — ED Triage Notes (Signed)
PT has pain and swelling to left wrist for several days. No injury

## 2018-04-21 NOTE — Telephone Encounter (Signed)
Copied from CRM [id number]. Topic: General - Other >> Apr 21, 2018  1:56 PM Maia Pettiesrtiz, Kristie S wrote: Reason for CRM: requesting orders for SN for diabetes management, needs to know how often pt needs seen, it is a new certification period. April has secure VM if she is unable to answer.  Pt is also needing a new glucose monitor as her readings are off. Pts is older. [id number] - [place], [place] - [address] [facility] [address] [phone] (Phone) [phone] (Fax)

## 2018-04-21 NOTE — ED Provider Notes (Signed)
[facility]    CSN: [id number] Arrival date & time: 04/21/18  1418     History   Chief Complaint Chief Complaint  Patient presents with  . Wrist Pain    HPI Roberta Anderson is a 84 y.o. female.   Patient is a 84 year old female with left wrist pain and swelling that has been constant for 2 weeks.  She is unsure of any injury to the wrist or hand.  Denies any repetitive movements with hand.  Reports history of arthritis in the knee.   Wrist Pain     Past Medical History:  Diagnosis Date  . Allergy   . Arthritis   . Asthma   . Dementia 11/05/2017  . Depression   . Diabetes mellitus without complication (HCC)   . History of stomach ulcers   . Hyperlipidemia   . Hypertension   . Rheumatic fever   . Stroke (HCC)   . Thyroid disease   . Urinary tract infection     Patient Active Problem List   Diagnosis Date Noted  . Acute lower UTI 11/05/2017  . CAP (community acquired pneumonia) 11/05/2017  . HTN (hypertension) 11/05/2017  . HLD (hyperlipidemia) 11/05/2017  . Hypothyroidism 11/05/2017  . Type 2 diabetes mellitus without complication (HCC) 11/05/2017  . Depression 11/05/2017  . Generalized weakness 11/05/2017  . Hypoxia 11/05/2017    Past Surgical History:  Procedure Laterality Date  . ABDOMINAL HYSTERECTOMY    . APPENDECTOMY    . CHOLECYSTECTOMY    . TONSILLECTOMY      OB History   None      Home Medications    Prior to Admission medications   Medication Sig Start Date End Date Taking? Authorizing Provider  Ascorbic Acid (VITAMIN C) 1000 MG tablet Take 1,000 mg by mouth daily.    [provider]  aspirin EC 81 MG tablet Take 81 mg by mouth daily.    [provider]  B Complex Vitamins (B COMPLEX PO) Take 1 tablet by mouth daily.    [provider]  Cholecalciferol (VITAMIN D PO) Take 1 tablet by mouth daily.    [provider]  CINNAMON PO Take 1,000 mg by mouth daily.    [provider]   Coenzyme Q10 (CO Q 10 PO) Take 1 tablet by mouth daily.    [provider]  CRANBERRY PO Take 1 tablet by mouth daily.    [provider]  dextromethorphan-guaiFENesin (MUCINEX DM) 30-600 MG 12hr tablet Take 1 tablet by mouth 2 (two) times daily. 11/07/17   Burnadette Pop, MD  digoxin (LANOXIN) 0.125 MG tablet Take 125 mcg by mouth daily. 10/15/17   [provider]  diltiazem (CARDIZEM CD) 120 MG 24 hr capsule Take 120 mg by mouth daily. For blood pressure 09/17/17   [provider]  Docusate Calcium (STOOL SOFTENER PO) Take 1 tablet by mouth daily.    [provider]  donepezil (ARICEPT) 10 MG tablet Take 10 mg by mouth every evening. 09/17/17   [provider]  DULoxetine (CYMBALTA) 30 MG capsule Take 30 mg by mouth daily. 10/15/17   [provider]  HUMALOG KWIKPEN 100 UNIT/ML KiwkPen Inject 5 Units into the skin 3 (three) times daily. BEFORE [facility] MEAL 08/20/17   [provider]  hydrOXYzine (ATARAX/VISTARIL) 25 MG tablet Take 25 mg by mouth 3 (three) times daily as needed for anxiety. 09/17/17   [provider]  LEVEMIR FLEXTOUCH 100 UNIT/ML Pen Inject 42 Units  into the skin every evening. ADJUST AS NEEDED 08/20/17   [provider]  levothyroxine (SYNTHROID, LEVOTHROID) 112 MCG tablet Take 112 mcg by mouth daily. 09/16/17   [provider]  meclizine (ANTIVERT) 25 MG tablet Take 25 mg by mouth 4 (four) times daily as needed for dizziness. 09/17/17   [provider]  meloxicam (MOBIC) 7.5 MG tablet Take 1 tablet (7.5 mg total) by mouth daily. 04/21/18   Dahlia ByesBast, Fateh Kindle A, NP  metFORMIN (GLUCOPHAGE XR) 500 MG 24 hr tablet Take 1 tablet (500 mg total) by mouth daily with breakfast. 02/24/18   Deeann SaintBanks, Shannon R, MD  pantoprazole (PROTONIX) 40 MG tablet Take 40 mg by mouth 2 (two) times daily. 09/17/17   [provider]  simvastatin (ZOCOR) 10 MG tablet Take 10 mg by mouth every evening. 09/17/17    [provider]    Family History Family History  Problem Relation Age of Onset  . Heart disease Mother   . Stroke Father   . Cancer Brother   . Diabetes Brother     Social History Social History   Tobacco Use  . Smoking status: Former Smoker    Last attempt to quit: 1995    Years since quitting: 24.5  . Smokeless tobacco: Never Used  Substance Use Topics  . Alcohol use: No    Frequency: Never  . Drug use: No     Allergies   Hydrocodone-acetaminophen; Levofloxacin; Cephalexin; Ciprofloxacin; Codeine; and Sulfamethoxazole-trimethoprim   Review of Systems Review of Systems  Constitutional: Negative for activity change and fever.  Musculoskeletal: Positive for joint swelling.  Skin: Negative for color change and rash.  Hematological: Does not bruise/bleed easily.  All other systems reviewed and are negative.    Physical Exam Triage Vital Signs ED Triage Vitals  Enc Vitals Group     BP 04/21/18 1446 (!) 151/59     Pulse Rate 04/21/18 1446 74     Resp 04/21/18 1446 18     Temp 04/21/18 1446 98 F (36.7 C)     Temp Source 04/21/18 1446 Oral     SpO2 04/21/18 1446 97 %     Weight --      Height --      Head Circumference --      Peak Flow --      Pain Score 04/21/18 1455 8     Pain Loc --      Pain Edu? --      Excl. in GC? --    No data found.  Updated Vital Signs BP (!) 151/59 (BP Location: Right Arm)   Pulse 74   Temp 98 F (36.7 C) (Oral)   Resp 18   SpO2 97%   Visual Acuity Right Eye Distance:   Left Eye Distance:   Bilateral Distance:    Right Eye Near:   Left Eye Near:    Bilateral Near:     Physical Exam  Constitutional: She appears well-developed and well-nourished.  HENT:  Head: Normocephalic.  Eyes: Pupils are equal, round, and reactive to light.  Neck: Normal range of motion.  Cardiovascular: Normal rate.  Pulmonary/Chest: Effort normal.  Musculoskeletal:  Swelling and pain at the base of the left thumb with  extension into the wrist.  Positive Finkelstein test.  Radial pulse intact.  Nursing note and vitals reviewed.    UC Treatments / Results  Labs (all labs ordered are listed, but only abnormal results are displayed) Labs Reviewed - No data to  display  EKG None  Radiology Dg Wrist Complete Left  Result Date: 04/21/2018 CLINICAL DATA:  Left wrist pain and swelling x1 month. EXAM: LEFT WRIST - COMPLETE 3+ VIEW COMPARISON:  None. FINDINGS: Soft tissue swelling along the dorsal, radial and ulnar aspect of the distal forearm and wrist. The bones are demineralized in appearance. Osteoarthritic joint space narrowing of the first [facility] and triscaphe joints of the hand and wrist are identified. No suspicious osseous lesions nor fracture. No significant bony erosive change. IMPRESSION: Nonspecific soft tissue swelling about the distal forearm and wrist may be secondary to a tenosynovitis given lack of significant osseous findings apart from some mild osteoarthritis of the first [facility] and triscaphe joints. Electronically Signed   By: Tollie Eth M.D.   On: 04/21/2018 15:27    Procedures Procedures (including critical care time)  Medications Ordered in UC Medications - No data to display  Initial Impression / Assessment and Plan / UC Course  I have reviewed the triage vital signs and the nursing notes.  Pertinent labs & imaging results that were available during my care of the patient were reviewed by me and considered in my medical decision making (see chart for details).   Performed x-ray due to patient's history of dementia and unsure of injury. X-ray negative for fracture.  Suspect de Quervain's tenosynovitis.  Will mobilize with thumb spica splint.  Mobic for pain and inflammation.  Final Clinical Impressions(s) / UC Diagnoses   Final diagnoses:  Tenosynovitis     Discharge Instructions     It was nice meeting you!!  Your x-ray was negative.  I believe this is tendinitis in your  wrist. We immobilized your wrist with a splint and prescribed some medicine for pain and inflammation. If this does not get better follow-up with your primary care doctor.    ED Prescriptions    Medication Sig Dispense Auth. Provider   meloxicam (MOBIC) 7.5 MG tablet Take 1 tablet (7.5 mg total) by mouth daily. 30 tablet Dahlia Byes A, NP     Controlled Substance Prescriptions [place] Controlled Substance Registry consulted? Not Applicable   Janace Aris, NP 04/21/18 [phone]

## 2018-04-22 ENCOUNTER — Other Ambulatory Visit: Payer: Self-pay

## 2018-04-23 ENCOUNTER — Other Ambulatory Visit: Payer: Self-pay

## 2018-04-24 ENCOUNTER — Telehealth: Payer: Self-pay

## 2018-04-24 ENCOUNTER — Other Ambulatory Visit: Payer: Self-pay

## 2018-04-24 MED ORDER — ACCU-CHEK GUIDE ME W/DEVICE KIT: 1.0000 | PACK | Freq: Once | 0 refills | Status: AC

## 2018-04-24 NOTE — Telephone Encounter (Signed)
Called left a message for Roberta Anderson with [facility] stating that Dr Salomon FickBanks is approving the SN with diabetic

## 2018-04-24 NOTE — Telephone Encounter (Signed)
April from [facility] checking status. Please advise. Call back [phone]

## 2018-04-24 NOTE — Telephone Encounter (Signed)
Please advise 

## 2018-04-24 NOTE — Telephone Encounter (Signed)
Called April with [facility] left a detailed message that Dr Salomon FickBanks approves for pt to have SN for diabetes management daily.

## 2018-04-24 NOTE — Telephone Encounter (Signed)
A diabetic meter has been sent to pt pharmacy per pt request.

## 2018-05-21 ENCOUNTER — Other Ambulatory Visit: Payer: Self-pay | Admitting: Family Medicine

## 2018-05-21 DIAGNOSIS — E1165 Type 2 diabetes mellitus with hyperglycemia: Secondary | ICD-10-CM

## 2018-05-25 ENCOUNTER — Ambulatory Visit: Payer: Self-pay | Admitting: Family Medicine

## 2018-05-25 NOTE — Telephone Encounter (Signed)
I think she needs to be seen.  Will likely need some modification of her insulin regimen.

## 2018-05-25 NOTE — Telephone Encounter (Signed)
Patient's levels- 9:00  221 8/11- fasting 10:05 235, 12:20 238, 5:25 417 ( in morning she seems to be in 230 ranges) Glucose numbers seem to go up at afternoon hours and late evening BP 140/78 Call to office for guidance with patient- education may not be enough. Office conferenced with home nurse for decision on direction of care with patient Reason for Disposition . Blood glucose > 500 mg/dL (09.827.8 mmol/L)  Answer Assessment - Initial Assessment Questions 1. BLOOD GLUCOSE: "What is your blood glucose level?"      515 2. ONSET: "When did you check the blood glucose?"     1:15 3. USUAL RANGE: "What is your glucose level usually?" (e.g., usual fasting morning value, usual evening value)     Normal levels- range 234-515 4. KETONES: "Do you check for ketones (urine or blood test strips)?" If yes, ask: "What does the test show now?"      n/a 5. TYPE 1 or 2:  "Do you know what type of diabetes you have?"  (e.g., Type 1, Type 2, Gestational; doesn't know)      Type 2 6. INSULIN: "Do you take insulin?" "What type of insulin(s) do you use? What is the mode of delivery? (syringe, pen (e.g., injection or  pump)?"      Insulin- Humalog- 5 units -after meals , Levimir- injection-42 units 7. DIABETES PILLS: "Do you take any pills for your diabetes?" If yes, ask: "Have you missed taking any pills recently?"     Metformin in morning-- no missed doses 8. OTHER SYMPTOMS: "Do you have any symptoms?" (e.g., fever, frequent urination, difficulty breathing, dizziness, weakness, vomiting)     No symptoms 9. PREGNANCY: "Is there any chance you are pregnant?" "When was your last menstrual period?"     n/a  Protocols used: DIABETES - HIGH BLOOD SUGAR-A-AH

## 2018-05-25 NOTE — Telephone Encounter (Signed)
We have sent you a staff message about this as well. Can you please forward to Bee RidgeNancy once reviewed, please.

## 2018-05-26 NOTE — Telephone Encounter (Signed)
I spoke with pt this morning and she did schedule a office visit for 05/27/2018 with Dr. Salomon FickBanks. She checked blood sugar this morning it was 293 and took prescribed amount according previous directions/sliding scale, no symptoms reported at this time, will forward note to Dr. Salomon FickBanks for review.

## 2018-05-27 ENCOUNTER — Encounter: Payer: Self-pay | Admitting: Family Medicine

## 2018-05-27 ENCOUNTER — Ambulatory Visit (INDEPENDENT_AMBULATORY_CARE_PROVIDER_SITE_OTHER): Payer: Medicare Other | Admitting: Family Medicine

## 2018-05-27 VITALS — BP 132/70 | HR 76 | Temp 97.8°F | Wt 199.0 lb

## 2018-05-27 DIAGNOSIS — E039 Hypothyroidism, unspecified: Secondary | ICD-10-CM

## 2018-05-27 DIAGNOSIS — E1165 Type 2 diabetes mellitus with hyperglycemia: Secondary | ICD-10-CM | POA: Diagnosis not present

## 2018-05-27 DIAGNOSIS — H3530 Unspecified macular degeneration: Secondary | ICD-10-CM | POA: Diagnosis not present

## 2018-05-27 LAB — POCT GLUCOSE (DEVICE FOR HOME USE): POC Glucose: 425 mg/dl — AB (ref 70–99)

## 2018-05-27 LAB — POCT GLYCOSYLATED HEMOGLOBIN (HGB A1C): HEMOGLOBIN A1C: 12.2 % — AB (ref 4.0–5.6)

## 2018-05-27 MED ORDER — LEVEMIR FLEXTOUCH 100 UNIT/ML ~~LOC~~ SOPN: 45.0000 [IU] | PEN_INJECTOR | Freq: Every evening | SUBCUTANEOUS | 11 refills | Status: AC

## 2018-05-27 MED ORDER — METFORMIN HCL ER 500 MG PO TB24: 1000.0000 mg | ORAL_TABLET | Freq: Every day | ORAL | 3 refills | Status: AC

## 2018-05-27 NOTE — Telephone Encounter (Signed)
Pt seen in office today.  Insulin adjusted.

## 2018-05-27 NOTE — Patient Instructions (Addendum)
We need to increase your Metformin to 1000 mg in the morning, this means you will take two pills.  I have also increased your Levemir to 45 units at night.  You should follow up in clinic in 1 month.  I have also put in a referral for you to see the endocrinologist.   Diabetes Mellitus and Nutrition When you have diabetes (diabetes mellitus), it is very important to have healthy eating habits because your blood sugar (glucose) levels are greatly affected by what you eat and drink. Eating healthy foods in the appropriate amounts, at about the same times every day, can help you:  Control your blood glucose.  Lower your risk of heart disease.  Improve your blood pressure.  Reach or maintain a healthy weight.  Every person with diabetes is different, and each person has different needs for a meal plan. Your health care provider may recommend that you work with a diet and nutrition specialist (dietitian) to make a meal plan that is best for you. Your meal plan may vary depending on factors such as:  The calories you need.  The medicines you take.  Your weight.  Your blood glucose, blood pressure, and cholesterol levels.  Your activity level.  Other health conditions you have, such as heart or kidney disease.  How do carbohydrates affect me? Carbohydrates affect your blood glucose level more than any other type of food. Eating carbohydrates naturally increases the amount of glucose in your blood. Carbohydrate counting is a method for keeping track of how many carbohydrates you eat. Counting carbohydrates is important to keep your blood glucose at a healthy level, especially if you use insulin or take certain oral diabetes medicines. It is important to know how many carbohydrates you can safely have in each meal. This is different for every person. Your dietitian can help you calculate how many carbohydrates you should have at each meal and for snack. Foods that contain carbohydrates  include:  Bread, cereal, rice, pasta, and crackers.  Potatoes and corn.  Peas, beans, and lentils.  Milk and yogurt.  Fruit and juice.  Desserts, such as cakes, cookies, ice cream, and candy.  How does alcohol affect me? Alcohol can cause a sudden decrease in blood glucose (hypoglycemia), especially if you use insulin or take certain oral diabetes medicines. Hypoglycemia can be a life-threatening condition. Symptoms of hypoglycemia (sleepiness, dizziness, and confusion) are similar to symptoms of having too much alcohol. If your health care provider says that alcohol is safe for you, follow these guidelines:  Limit alcohol intake to no more than 1 drink per day for nonpregnant women and 2 drinks per day for men. One drink equals 12 oz of beer, 5 oz of wine, or 1 oz of hard liquor.  Do not drink on an empty stomach.  Keep yourself hydrated with water, diet soda, or unsweetened iced tea.  Keep in mind that regular soda, juice, and other mixers may contain a lot of sugar and must be counted as carbohydrates.  What are tips for following this plan? Reading food labels  Start by checking the serving size on the label. The amount of calories, carbohydrates, fats, and other nutrients listed on the label are based on one serving of the food. Many foods contain more than one serving per package.  Check the total grams (g) of carbohydrates in one serving. You can calculate the number of servings of carbohydrates in one serving by dividing the total carbohydrates by 15. For example, if  a food has 30 g of total carbohydrates, it would be equal to 2 servings of carbohydrates.  Check the number of grams (g) of saturated and trans fats in one serving. Choose foods that have low or no amount of these fats.  Check the number of milligrams (mg) of sodium in one serving. Most people should limit total sodium intake to less than 2,300 mg per day.  Always check the nutrition information of foods  labeled as "low-fat" or "nonfat". These foods may be higher in added sugar or refined carbohydrates and should be avoided.  Talk to your dietitian to identify your daily goals for nutrients listed on the label. Shopping  Avoid buying canned, premade, or processed foods. These foods tend to be high in fat, sodium, and added sugar.  Shop around the outside edge of the grocery store. This includes fresh fruits and vegetables, bulk grains, fresh meats, and fresh dairy. Cooking  Use low-heat cooking methods, such as baking, instead of high-heat cooking methods like deep frying.  Cook using healthy oils, such as olive, canola, or sunflower oil.  Avoid cooking with butter, cream, or high-fat meats. Meal planning  Eat meals and snacks regularly, preferably at the same times every day. Avoid going long periods of time without eating.  Eat foods high in fiber, such as fresh fruits, vegetables, beans, and whole grains. Talk to your dietitian about how many servings of carbohydrates you can eat at each meal.  Eat 4-6 ounces of lean protein each day, such as lean meat, chicken, fish, eggs, or tofu. 1 ounce is equal to 1 ounce of meat, chicken, or fish, 1 egg, or 1/4 cup of tofu.  Eat some foods each day that contain healthy fats, such as avocado, nuts, seeds, and fish. Lifestyle   Check your blood glucose regularly.  Exercise at least 30 minutes 5 or more days each week, or as told by your health care provider.  Take medicines as told by your health care provider.  Do not use any products that contain nicotine or tobacco, such as cigarettes and e-cigarettes. If you need help quitting, ask your health care provider.  Work with a Veterinary surgeoncounselor or diabetes educator to identify strategies to manage stress and any emotional and social challenges. What are some questions to ask my health care provider?  Do I need to meet with a diabetes educator?  Do I need to meet with a dietitian?  What number  can I call if I have questions?  When are the best times to check my blood glucose? Where to find more information:  American Diabetes Association: diabetes.org/food-and-fitness/food  Academy of Nutrition and Dietetics: https://www.vargas.com/www.eatright.org/resources/health/diseases-and-conditions/diabetes  [employer] Institute of Diabetes and Digestive and Kidney Diseases (NIH): FindJewelers.czwww.niddk.nih.gov/health-information/diabetes/overview/diet-eating-physical-activity Summary  A healthy meal plan will help you control your blood glucose and maintain a healthy lifestyle.  Working with a diet and nutrition specialist (dietitian) can help you make a meal plan that is best for you.  Keep in mind that carbohydrates and alcohol have immediate effects on your blood glucose levels. It is important to count carbohydrates and to use alcohol carefully. This information is not intended to replace advice given to you by your health care provider. Make sure you discuss any questions you have with your health care provider. Document Released: 06/27/2005 Document Revised: 11/04/2016 Document Reviewed: 11/04/2016 Elsevier Interactive Patient Education  [employer]2018 Elsevier Inc.

## 2018-05-27 NOTE — Progress Notes (Signed)
Subjective:    Patient ID: Roberta Anderson, female    DOB: 1934-02-03, 84 y.o.   MRN: [id number]  No chief complaint on file.   HPI Patient was seen today for f/u on DMII.  Pt taking Humalog 5 units TID w/ meals and levemir 42 units qhs, and metformin XR 500 mg q am.  Pt lives with her twin sister.  At times pt forgets to take her meds.  Per pt's sister she was unaware that pt needed to take her insulin prior to eating.  HH nurse comes 1x /wk.   Pt admits to eating sugar free candy that she has in her room.  Pt's sister was unaware of this.  Pt taking synthroid 112 mcg daily for hypothyroidism.  Past Medical History:  Diagnosis Date  . Allergy   . Arthritis   . Asthma   . Dementia 11/05/2017  . Depression   . Diabetes mellitus without complication (HCC)   . History of stomach ulcers   . Hyperlipidemia   . Hypertension   . Rheumatic fever   . Stroke (HCC)   . Thyroid disease   . Urinary tract infection     Allergies  Allergen Reactions  . Hydrocodone-Acetaminophen Shortness Of Breath  . Levofloxacin Shortness Of Breath  . Cephalexin Rash  . Ciprofloxacin Rash  . Codeine Rash  . Sulfamethoxazole-Trimethoprim Rash    ROS General: Denies fever, chills, night sweats, changes in weight, changes in appetite HEENT: Denies headaches, ear pain, changes in vision, rhinorrhea, sore throat  +h/o macular degeneration in R eye CV: Denies CP, palpitations, SOB, orthopnea Pulm: Denies SOB, cough, wheezing GI: Denies abdominal pain, nausea, vomiting, diarrhea, constipation GU: Denies dysuria, hematuria, vaginal discharge  +increased urination Msk: Denies muscle cramps, joint pains Neuro: Denies weakness, numbness, tingling Skin: Denies rashes, bruising Psych: Denies depression, anxiety, hallucinations     Objective:    Blood pressure 132/70, pulse 76, temperature 97.8 F (36.6 C), weight 199 lb (90.3 kg), SpO2 95 %.   Gen. Pleasant, well-nourished, in no distress, normal  affect   HEENT: [place]/AT, face symmetric, no scleral icterus, nares patent without drainage Lungs: no accessory muscle use, CTAB, no wheezes or rales Cardiovascular: RRR, no m/r/g, no peripheral edema Neuro:  A&Ox3, CN II-XII intact, normal gait Skin:  Warm, no lesions/ rash   Wt Readings from Last 3 Encounters:  05/27/18 199 lb (90.3 kg)  02/02/18 199 lb 8 oz (90.5 kg)  12/26/17 202 lb (91.6 kg)    Lab Results  Component Value Date   WBC 8.9 11/06/2017   HGB 13.0 11/06/2017   HCT 38.4 11/06/2017   PLT 214 11/06/2017   GLUCOSE 265 (H) 11/06/2017   ALT 25 11/05/2017   AST 30 11/05/2017   NA 135 11/06/2017   K 4.1 11/06/2017   CL 101 11/06/2017   CREATININE 0.70 11/06/2017   BUN 14 11/06/2017   CO2 25 11/06/2017   TSH 0.965 11/05/2017   HGBA1C 111.9 02/02/2018    Assessment/Plan:  Uncontrolled type 2 diabetes mellitus with hyperglycemia (HCC)  -hgb A1C 12.2 %  -Fsbs 425 in office. -will increase metformin XR to 1000 mg in am -will increase Levemir to 45 units qhs -continue humalog 5 units TID with meals -discussed possibly having [facility] help pt with medical needs.   -discussed referrals to Endo and Ophtho - Plan: POCT Glucose (Device for Home Use), POCT glycosylated hemoglobin (Hb A1C), metFORMIN (GLUCOPHAGE XR) 500 MG 24 hr tablet, Ambulatory referral to Endocrinology, LEVEMIR  FLEXTOUCH 100 UNIT/ML Pen, TSH, Basic metabolic panel, Microalbumin/Creatinine Ratio, Urine, Ambulatory referral to Ophthalmology  Macular degeneration of right eye, unspecified type  - Plan: Ambulatory referral to Ophthalmology  Hypothyroidism -We will obtain TSH -Continue 112 MCG daily.  Will adjust dose based on labs if needed.  F/u in 1 month  More than 50% of over 20 minutes spent in total in caring for this patient was spent face-to-face with the patient, counseling and/or coordinating care.    Abbe AmsterdamShannon Sevrin Sally, MD

## 2018-05-28 LAB — BASIC METABOLIC PANEL
BUN: 18 mg/dL (ref 6–23)
CALCIUM: 10.9 mg/dL — AB (ref 8.4–10.5)
CO2: 26 meq/L (ref 19–32)
CREATININE: 1 mg/dL (ref 0.40–1.20)
Chloride: 97 mEq/L (ref 96–112)
GFR: 56.39 mL/min — AB (ref >60.00)
Glucose, Bld: 453 mg/dL — ABNORMAL HIGH (ref 70–99)
Potassium: 4.7 mEq/L (ref 3.5–5.1)
SODIUM: 133 meq/L — AB (ref 135–145)

## 2018-05-28 LAB — TSH: TSH: 11.09 u[IU]/mL — AB (ref 0.35–4.50)

## 2018-06-18 LAB — HM DIABETES EYE EXAM

## 2018-06-25 ENCOUNTER — Other Ambulatory Visit: Payer: Self-pay | Admitting: Family Medicine

## 2018-06-26 ENCOUNTER — Telehealth: Payer: Self-pay | Admitting: Family Medicine

## 2018-06-26 MED ORDER — DULOXETINE HCL 30 MG PO CPEP: 30.0000 mg | ORAL_CAPSULE | Freq: Every day | ORAL | 0 refills | Status: DC

## 2018-06-26 MED ORDER — DIGOXIN 125 MCG PO TABS: 125.0000 ug | ORAL_TABLET | Freq: Every day | ORAL | 0 refills | Status: DC

## 2018-06-26 NOTE — Telephone Encounter (Signed)
Copied from CRM [phone]#[id number]. Topic: Quick Communication - See Telephone Encounter >> Jun 26, 2018 11:01 AM Windy KalataMichael, Catalino Plascencia L, NT wrote: CRM for notification. See Telephone encounter for: 06/26/18.  Patient sister is calling and states the patient needs a refill on DULoxetine (CYMBALTA) 30 MG capsule and digoxin (LANOXIN) 0.125 MG tablet . Please advise.  [id number] [place], [place] - [address] [facility] [id number] [place] [id number] Phone: [phone] Fax: [phone]

## 2018-06-29 ENCOUNTER — Telehealth: Payer: Self-pay | Admitting: Family Medicine

## 2018-06-29 NOTE — Telephone Encounter (Signed)
Copied from CRM #[id number]. Topic: General - Other >> Jun 29, 2018 10:50 [phone] Ronney LionArrington, Roberta A wrote: Medication: DULoxetine (CYMBALTA) 30 MG   Has the patient contacted their pharmacy? Yes, patient went to pharmacy. And they didn't give her the correct Rx. They gave her a BP medication instead of the Cymbalta  (Agent: If yes, when and what did the pharmacy advise?) Wrong RX given to patient.  Preferred Pharmacy (with phone number or street name): [id number] [place], [place] - [address] [facility] [address]  [phone] (Phone) [phone] (Fax)

## 2018-06-29 NOTE — Telephone Encounter (Signed)
I returned her call.    She said [facility] [address] "gave her the wrong medication.    "They gave me my BP medication not the Cymbalta".    I also need my Lanoxin refilled and the Cymbalta.    I let her know she needed to contact the pharmacy and let them know.   I checked her medication approval the the Lanoxin and Cymbalta.  Dr. Salomon FickBanks approved both of these refills on 06/26/18.     Pt is going to contact pharmacy.

## 2018-07-01 ENCOUNTER — Ambulatory Visit: Payer: Medicare Other | Admitting: Family Medicine

## 2018-07-02 ENCOUNTER — Ambulatory Visit: Payer: Medicare Other | Admitting: Family Medicine

## 2018-07-03 ENCOUNTER — Encounter: Payer: Self-pay | Admitting: Family Medicine

## 2018-07-09 ENCOUNTER — Encounter: Payer: Self-pay | Admitting: Family Medicine

## 2018-07-09 ENCOUNTER — Ambulatory Visit (INDEPENDENT_AMBULATORY_CARE_PROVIDER_SITE_OTHER): Payer: Medicare Other | Admitting: Family Medicine

## 2018-07-09 VITALS — BP 138/72 | HR 64 | Temp 97.7°F | Wt 205.2 lb

## 2018-07-09 DIAGNOSIS — Z23 Encounter for immunization: Secondary | ICD-10-CM

## 2018-07-09 DIAGNOSIS — E119 Type 2 diabetes mellitus without complications: Secondary | ICD-10-CM | POA: Diagnosis not present

## 2018-07-09 DIAGNOSIS — Z794 Long term (current) use of insulin: Secondary | ICD-10-CM | POA: Diagnosis not present

## 2018-07-09 NOTE — Progress Notes (Signed)
Subjective:    Patient ID: Roberta Anderson, female    DOB: [birth year], 84 y.o.   MRN: [id number]  No chief complaint on file.   HPI Patient was seen today for f/u on DM II.  Pt taking levemir 45 units qhs and Humalog 5 units TID with meals.  Pt's fsbs log 250s-upper 300s.  No longer consistently in 500s.  Pt states she is feeling good.  Pt's sister states pt has been doing better.  Nurse is still coming to the house to help.  Interested in influenza vaccine.  Past Medical History:  Diagnosis Date  . Allergy   . Arthritis   . Asthma   . Dementia 11/05/2017  . Depression   . Diabetes mellitus without complication (HCC)   . History of stomach ulcers   . Hyperlipidemia   . Hypertension   . Rheumatic fever   . Stroke (HCC)   . Thyroid disease   . Urinary tract infection     Allergies  Allergen Reactions  . Hydrocodone-Acetaminophen Shortness Of Breath  . Levofloxacin Shortness Of Breath  . Cephalexin Rash  . Ciprofloxacin Rash  . Codeine Rash  . Sulfamethoxazole-Trimethoprim Rash    ROS General: Denies fever, chills, night sweats, changes in weight, changes in appetite HEENT: Denies headaches, ear pain, changes in vision, rhinorrhea, sore throat CV: Denies CP, palpitations, SOB, orthopnea Pulm: Denies SOB, cough, wheezing GI: Denies abdominal pain, nausea, vomiting, diarrhea, constipation GU: Denies dysuria, hematuria, frequency, vaginal discharge Msk: Denies muscle cramps, joint pains Neuro: Denies weakness, numbness, tingling Skin: Denies rashes, bruising Psych: Denies depression, anxiety, hallucinations     Objective:    Blood pressure 138/72, pulse 64, temperature 97.7 F (36.5 C), weight 205 lb 3.2 oz (93.1 kg), SpO2 95 %.   Gen. Pleasant, well-nourished, in no distress, normal affect, pleasantly demented HEENT: [place]/AT, face symmetric, no scleral icterus, PERRLA, nares patent without drainage Lungs: no accessory muscle use, CTAB, no wheezes or  rales Cardiovascular: RRR, no m/r/g, no peripheral edema   Wt Readings from Last 3 Encounters:  05/27/18 199 lb (90.3 kg)  02/02/18 199 lb 8 oz (90.5 kg)  12/26/17 202 lb (91.6 kg)    Lab Results  Component Value Date   WBC 8.9 11/06/2017   HGB 13.0 11/06/2017   HCT 38.4 11/06/2017   PLT 214 11/06/2017   GLUCOSE 453 (H) 05/27/2018   ALT 25 11/05/2017   AST 30 11/05/2017   NA 133 (L) 05/27/2018   K 4.7 05/27/2018   CL 97 05/27/2018   CREATININE 1.00 05/27/2018   BUN 18 05/27/2018   CO2 26 05/27/2018   TSH 11.09 (H) 05/27/2018   HGBA1C 12.2 (A) 05/27/2018    Assessment/Plan:  Type 2 diabetes mellitus without complication, with long-term current use of insulin (HCC)  -fsbs improving, but still elevated  -will increase Humalog to 7 units 3 times daily with meals -We will increase Levemir to 47 units nightly -Continue checking FS BS daily and keeping a log -Patient congratulated on improvement -Follow-up in 1 to 2 months, sooner if needed  Need for influenza vaccination -Given high-dose influenza vaccine this visit  Abbe Amsterdam, MD

## 2018-07-09 NOTE — Patient Instructions (Addendum)
At today's visit we discussed increasing your Humalog to 7 units 3 times a day with meals. We also discussed increasing your Levemir to 47 units every night.  Continue to keep a log of your blood sugar readings.  You have made great improvements thus far, keep up the good work. Diabetes Mellitus and Exercise Exercising regularly is important for your overall health, especially when you have diabetes (diabetes mellitus). Exercising is not only about losing weight. It has many health benefits, such as increasing muscle strength and bone density and reducing body fat and stress. This leads to improved fitness, flexibility, and endurance, all of which result in better overall health. Exercise has additional benefits for people with diabetes, including:  Reducing appetite.  Helping to lower and control blood glucose.  Lowering blood pressure.  Helping to control amounts of fatty substances (lipids) in the blood, such as cholesterol and triglycerides.  Helping the body to respond better to insulin (improving insulin sensitivity).  Reducing how much insulin the body needs.  Decreasing the risk for heart disease by: ? Lowering cholesterol and triglyceride levels. ? Increasing the levels of good cholesterol. ? Lowering blood glucose levels.  What is my activity plan? Your health care provider or certified diabetes educator can help you make a plan for the type and frequency of exercise (activity plan) that works for you. Make sure that you:  Do at least 150 minutes of moderate-intensity or vigorous-intensity exercise each week. This could be brisk walking, biking, or water aerobics. ? Do stretching and strength exercises, such as yoga or weightlifting, at least 2 times a week. ? Spread out your activity over at least 3 days of the week.  Get some form of physical activity every day. ? Do not go more than 2 days in a row without some kind of physical activity. ? Avoid being inactive for more  than 90 minutes at a time. Take frequent breaks to walk or stretch.  Choose a type of exercise or activity that you enjoy, and set realistic goals.  Start slowly, and gradually increase the intensity of your exercise over time.  What do I need to know about managing my diabetes?  Check your blood glucose before and after exercising. ? If your blood glucose is higher than 240 mg/dL (16.1 mmol/L) before you exercise, check your urine for ketones. If you have ketones in your urine, do not exercise until your blood glucose returns to normal.  Know the symptoms of low blood glucose (hypoglycemia) and how to treat it. Your risk for hypoglycemia increases during and after exercise. Common symptoms of hypoglycemia can include: ? Hunger. ? Anxiety. ? Sweating and feeling clammy. ? Confusion. ? Dizziness or feeling light-headed. ? Increased heart rate or palpitations. ? Blurry vision. ? Tingling or numbness around the mouth, lips, or tongue. ? Tremors or shakes. ? Irritability.  Keep a rapid-acting carbohydrate snack available before, during, and after exercise to help prevent or treat hypoglycemia.  Avoid injecting insulin into areas of the body that are going to be exercised. For example, avoid injecting insulin into: ? The arms, when playing tennis. ? The legs, when jogging.  Keep records of your exercise habits. Doing this can help you and your health care provider adjust your diabetes management plan as needed. Write down: ? Food that you eat before and after you exercise. ? Blood glucose levels before and after you exercise. ? The type and amount of exercise you have done. ? When your insulin is  expected to peak, if you use insulin. Avoid exercising at times when your insulin is peaking.  When you start a new exercise or activity, work with your health care provider to make sure the activity is safe for you, and to adjust your insulin, medicines, or food intake as needed.  Drink  plenty of water while you exercise to prevent dehydration or heat stroke. Drink enough fluid to keep your urine clear or pale yellow. This information is not intended to replace advice given to you by your health care provider. Make sure you discuss any questions you have with your health care provider. Document Released: 12/21/2003 Document Revised: 04/19/2016 Document Reviewed: 03/11/2016 Elsevier Interactive Patient Education  2018 [employer].

## 2018-07-10 ENCOUNTER — Telehealth: Payer: Self-pay | Admitting: Family Medicine

## 2018-07-10 NOTE — Telephone Encounter (Signed)
Nelida Gores with [facility] left a message if she has any questions to call our office back.

## 2018-07-10 NOTE — Telephone Encounter (Signed)
Copied from CRM [id number]. Topic: Quick Communication - See Telephone Encounter >> Jul 10, 2018 11:32 AM Herby Abraham C wrote: CRM for notification. See Telephone encounter for: 07/10/18.  Mary RN w- [facility] is calling in requesting a call back from PCP CMA  Pt's BP 152/70 and she need to make sure that PCP is aware. Pt is not symptomatic   CB: [phone]

## 2018-07-14 ENCOUNTER — Telehealth: Payer: Self-pay | Admitting: Family Medicine

## 2018-07-14 NOTE — Telephone Encounter (Signed)
Recheck pt's bp and see what it is.  I would like her bp to be <140/90.  Did pt take her cardizem today?  Make sure pt is drinking plenty of water and reducing sodium intake.

## 2018-07-14 NOTE — Telephone Encounter (Signed)
Copied from CRM [id number]. Topic: Inquiry >> Jul 14, 2018  2:11 PM [name] B wrote: Reason for CRM: 162/85 is the reading for the pt's blood pressure for today; the pt's sister called and asked if this is needing attention; contact to advise

## 2018-07-14 NOTE — Telephone Encounter (Signed)
Pt is concerned, Please Advise

## 2018-07-15 NOTE — Telephone Encounter (Signed)
FYI  Spoke with pt stated that her BP was 136/70 today, pt states that she is taking her Cardizem daily, Advised pt to call to make sure that BP is lower than 140/90 the office if BP is higher than that. Pt voiced understanding

## 2018-07-16 NOTE — Telephone Encounter (Signed)
Roberta Anderson with [facility] called and stated that she will be discharging the pt today but wanted to update Dr. Salomon Fick of blood pressure today 160/80.[name] would also like to talk about blood sugar. She states that she has consistently running at 200. Roberta Anderson [phone]. Please advise.

## 2018-07-17 NOTE — Telephone Encounter (Signed)
Is pt taking her insulin? The dose of her insulin was increased at last OFV.  This has been an issue in the past, but she was doing better at last OFV.  Is there any way HH can be continued to help monitor medication?

## 2018-07-17 NOTE — Telephone Encounter (Signed)
Message sent to Dr. Sherlon Handing CMA.

## 2018-07-17 NOTE — Telephone Encounter (Signed)
Spoke with pt stated that her BP was 136/70 today, pt states that she is taking her Cardizem daily, Advised pt to call to make sure that BP is lower than 140/90 the office if BP is higher than that. Pt voiced understanding

## 2018-07-21 ENCOUNTER — Other Ambulatory Visit: Payer: Self-pay | Admitting: Family Medicine

## 2018-07-22 ENCOUNTER — Other Ambulatory Visit: Payer: Self-pay | Admitting: Family Medicine

## 2018-07-22 NOTE — Telephone Encounter (Signed)
Nelida Gores with [facility], left a detail message for a call back regarding DR Salomon Fick concerns on pt compliance with her insulin and her HH orders.

## 2018-07-23 NOTE — Telephone Encounter (Signed)
Roberta Anderson returned your call from [facility].  Please call back.

## 2018-07-23 NOTE — Telephone Encounter (Signed)
Roberta Anderson 07/23/2018 04:02 PM  [name] w/[facility] [phone] stated if the provider would like for [facility] to pick up the patient's medication management, they would need a new referral faxed to their office. Fax: [phone]

## 2018-07-23 NOTE — Telephone Encounter (Signed)
Returned the call for [facility] left a detailed message with approved verbal orders for medication management for pt.

## 2018-07-24 ENCOUNTER — Telehealth: Payer: Self-pay | Admitting: *Deleted

## 2018-07-24 NOTE — Telephone Encounter (Signed)
Tim Justis w/ [facility] dropped off [facility] Referral form for patient. Placed in your red folder. Please call Tim to discuss at [phone] or [phone].

## 2018-07-28 NOTE — Telephone Encounter (Signed)
Referral forms have been received and have been forwarded to Dr Salomon Fick for completion

## 2018-07-30 NOTE — Telephone Encounter (Signed)
Home Health Referral form has been completed and faxed to [facility]. Fax confirmation received

## 2018-07-30 NOTE — Telephone Encounter (Signed)
Home Health Referral form has been completed and faxed to [facility]. Fax confirmation received 

## 2018-08-12 ENCOUNTER — Encounter: Payer: Self-pay | Admitting: Family Medicine

## 2018-08-12 ENCOUNTER — Ambulatory Visit (INDEPENDENT_AMBULATORY_CARE_PROVIDER_SITE_OTHER): Payer: Medicare Other | Admitting: Family Medicine

## 2018-08-12 VITALS — BP 140/64 | HR 70 | Temp 97.7°F | Wt 208.0 lb

## 2018-08-12 DIAGNOSIS — E1165 Type 2 diabetes mellitus with hyperglycemia: Secondary | ICD-10-CM | POA: Diagnosis not present

## 2018-08-12 MED ORDER — ACCU-CHEK FASTCLIX LANCETS MISC: 1.0000 | Freq: Three times a day (TID) | 1 refills | Status: AC

## 2018-08-12 MED ORDER — GLUCOSE BLOOD VI STRP: ORAL_STRIP | 2 refills | Status: AC

## 2018-08-12 NOTE — Progress Notes (Signed)
Subjective:    Patient ID: Roberta Anderson, female    DOB: 1933-10-28, 84 y.o.   MRN: [id number]  No chief complaint on file. Pt accompanied by her twin sister Roberta Anderson.  HPI Patient was seen today for f/u on DM.  Since increasing levemir to 47 U and humalog to 7 U TID with meals.  Per sister, pt's bs has improved, now with some readings in the 100s.  Pt does not have bs log with her.  Pt still eating classic [place] foods, eating less sugar free candy as she forgets she has it 2/2 dementia.  Pt requesting a new glucometer.  Pt notes arms with scratches from pet cat.  The cat likes to grab the pt/dig her claws into the pt.  Past Medical History:  Diagnosis Date  . Allergy   . Arthritis   . Asthma   . Dementia (HCC) 11/05/2017  . Depression   . Diabetes mellitus without complication (HCC)   . History of stomach ulcers   . Hyperlipidemia   . Hypertension   . Rheumatic fever   . Stroke (HCC)   . Thyroid disease   . Urinary tract infection     Allergies  Allergen Reactions  . Hydrocodone-Acetaminophen Shortness Of Breath  . Levofloxacin Shortness Of Breath  . Cephalexin Rash  . Ciprofloxacin Rash  . Codeine Rash  . Sulfamethoxazole-Trimethoprim Rash    ROS General: Denies fever, chills, night sweats, changes in weight, changes in appetite  +dementia HEENT: Denies headaches, ear pain, changes in vision, rhinorrhea, sore throat CV: Denies CP, palpitations, SOB, orthopnea Pulm: Denies SOB, cough, wheezing GI: Denies abdominal pain, nausea, vomiting, diarrhea, constipation GU: Denies dysuria, hematuria, frequency, vaginal discharge Msk: Denies muscle cramps, joint pains Neuro: Denies weakness, numbness, tingling Skin: Denies rashes, bruising  +scratches from pet cat on b/l arms Psych: Denies depression, anxiety, hallucinations      Objective:    Blood pressure 140/64, pulse 70, temperature 97.7 F (36.5 C), temperature source Oral, weight 208 lb (94.3 kg), SpO2 97  %.  Gen. Pleasant, well-nourished, in no distress, normal affect, decreased short term memory   HEENT: [place]/AT, face symmetric, no scleral icterus, PERRLA, nares patent without drainage Lungs: no accessory muscle use, CTAB, no wheezes or rales Cardiovascular: RRR, no m/r/g, no peripheral edema Neuro:  A&Ox3, CN II-XII intact, ambulates with a cane Skin:  Warm, no rash.  Healing lesions/punctures on b/l forearms   Wt Readings from Last 3 Encounters:  08/12/18 208 lb (94.3 kg)  07/09/18 205 lb 3.2 oz (93.1 kg)  05/27/18 199 lb (90.3 kg)    Lab Results  Component Value Date   WBC 8.9 11/06/2017   HGB 13.0 11/06/2017   HCT 38.4 11/06/2017   PLT 214 11/06/2017   GLUCOSE 453 (H) 05/27/2018   ALT 25 11/05/2017   AST 30 11/05/2017   NA 133 (L) 05/27/2018   K 4.7 05/27/2018   CL 97 05/27/2018   CREATININE 1.00 05/27/2018   BUN 18 05/27/2018   CO2 26 05/27/2018   TSH 11.09 (H) 05/27/2018   HGBA1C 12.2 (A) 05/27/2018    Assessment/Plan:  Uncontrolled type 2 diabetes mellitus with hyperglycemia (HCC)  -improving  -will wait to see fsbs log in order to make adjustments in insulin regimen. -continue levemir  47 units qhs and humalog 7 units TID -Patient congratulated on improvement -given a glucometer this visit.  Follow-up in 1 to 2 months, sooner if needed.  Will recheck hgb A1C at next OFV.  Toivo Bordon, MD 

## 2018-08-31 ENCOUNTER — Other Ambulatory Visit: Payer: Self-pay | Admitting: Family Medicine

## 2018-08-31 NOTE — Telephone Encounter (Signed)
Patient's sister calling to check status of refill-Patient is out of this medication.

## 2018-09-03 ENCOUNTER — Other Ambulatory Visit: Payer: Self-pay | Admitting: Family Medicine

## 2018-09-19 ENCOUNTER — Other Ambulatory Visit: Payer: Self-pay | Admitting: Family Medicine

## 2019-11-06 IMAGING — DX DG WRIST COMPLETE 3+V*L*
4 series · 4 of 4 positions shown · non-contrast
Comparison: None.

CLINICAL DATA: Left wrist pain and swelling x1 month.

EXAM:
LEFT WRIST - COMPLETE 3+ VIEW

[wrist pa]
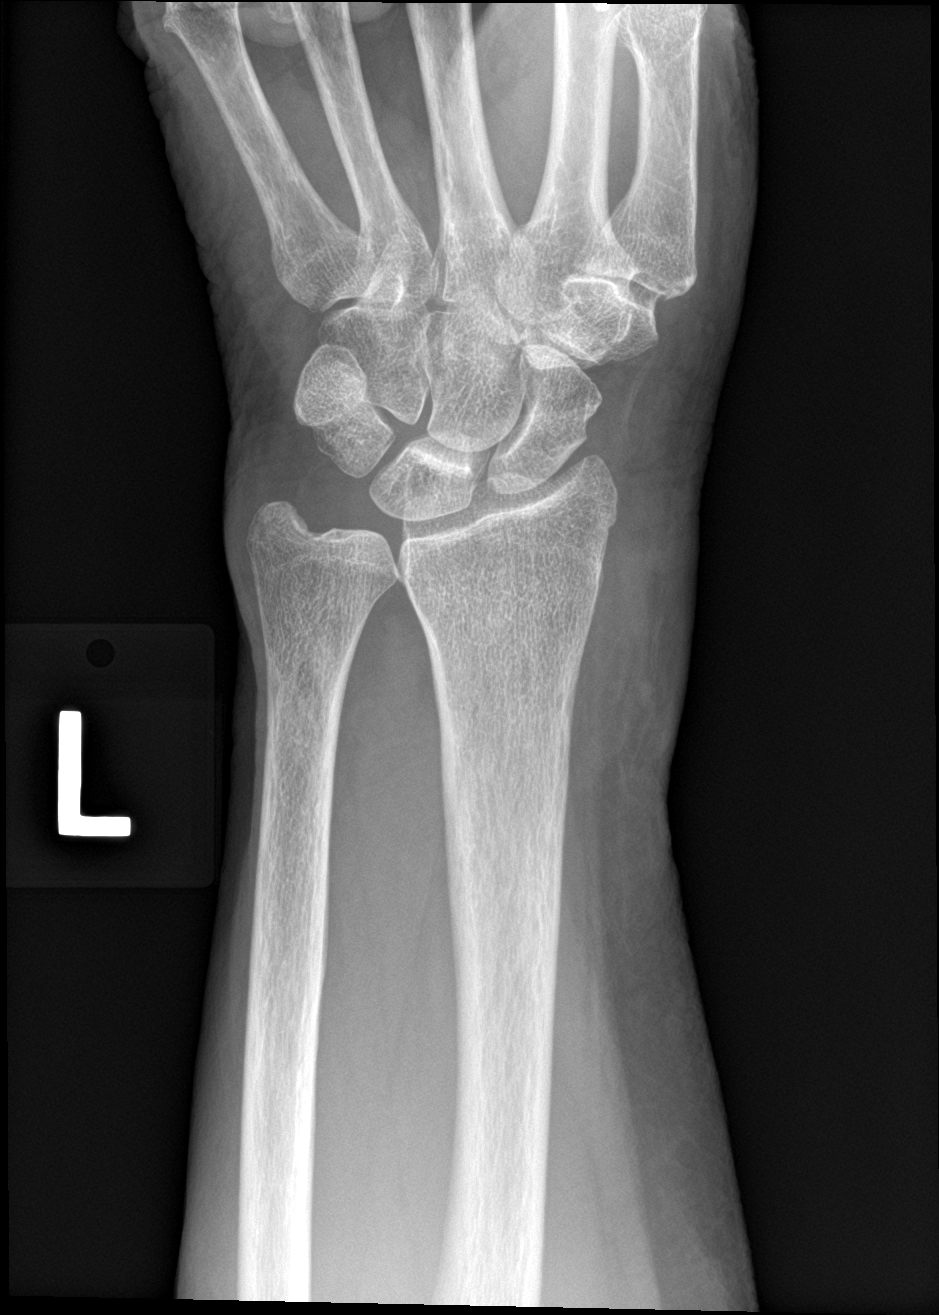

[wrist navicular]
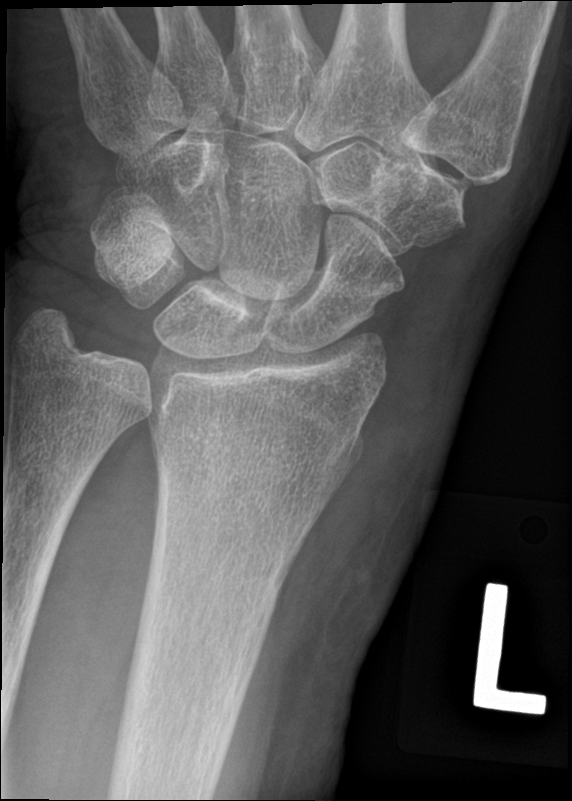

[wrist obl]
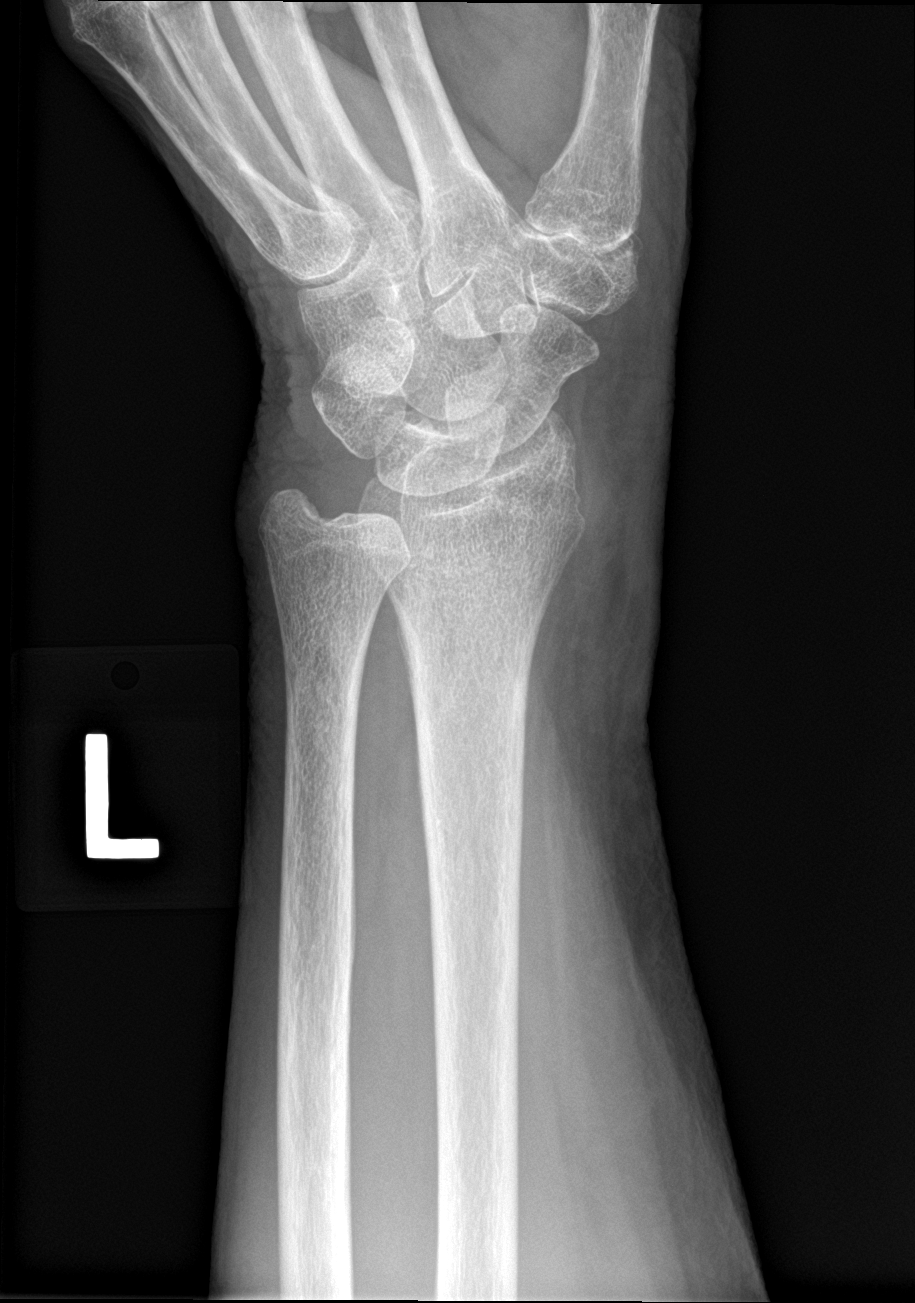

[wrist lat]
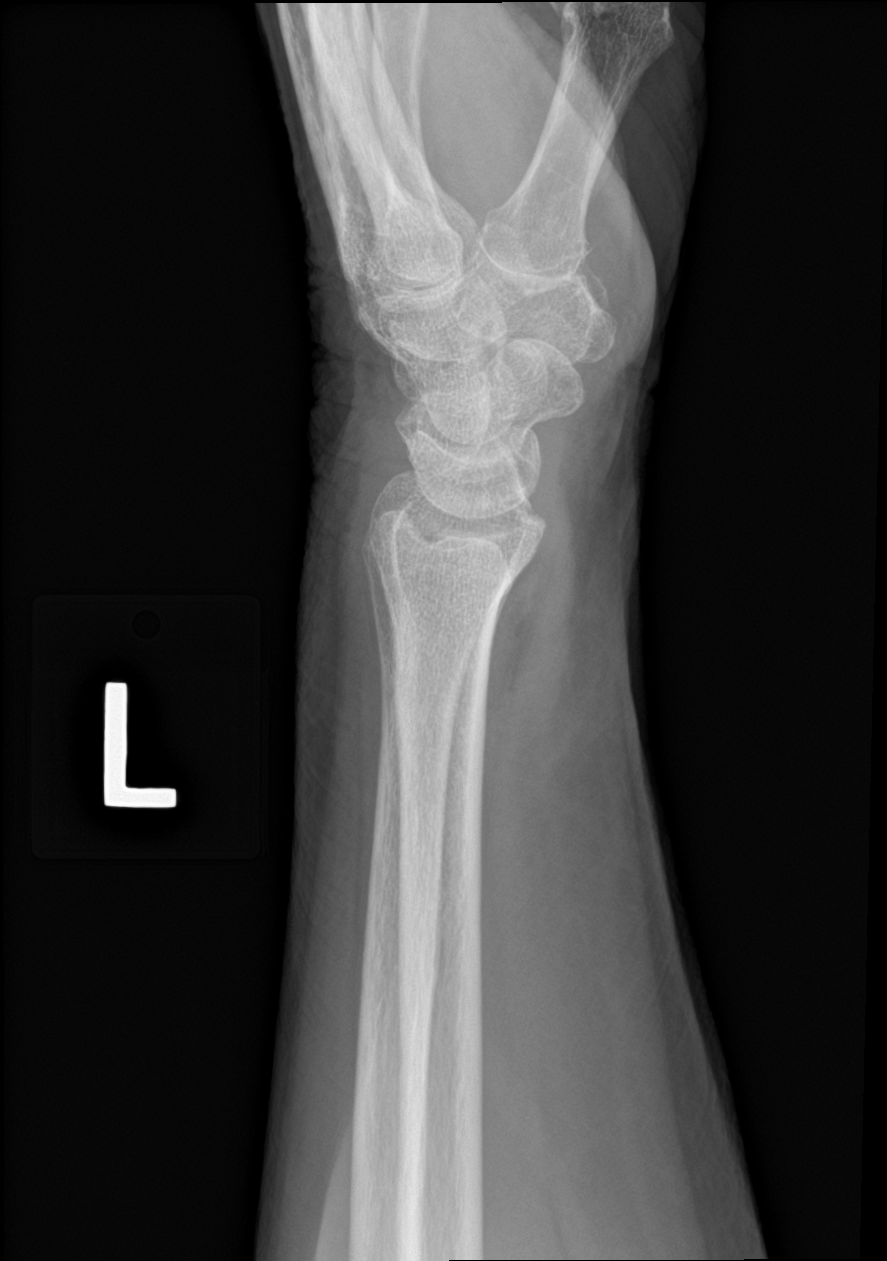

[4 of 4 positions shown; findings below may reference images not displayed]

FINDINGS: Soft tissue swelling along the dorsal, radial and ulnar aspect of
the distal forearm and wrist. The bones are demineralized in
appearance. Osteoarthritic joint space narrowing of the first CMC
and triscaphe joints of the hand and wrist are identified. No
suspicious osseous lesions nor fracture. No significant bony erosive
change.
IMPRESSION: Nonspecific soft tissue swelling about the distal forearm and wrist
may be secondary to a tenosynovitis given lack of significant
osseous findings apart from some mild osteoarthritis of the first
CMC and triscaphe joints.

## 2019-12-12 ENCOUNTER — Ambulatory Visit: Payer: Medicare Other

## 2020-09-13 DEATH — deceased
